# Patient Record
Sex: Male | Born: 1945
Health system: Southern US, Community
[De-identification: ages and names within clinical notes are randomized; demographics above are authoritative.]

## PROBLEM LIST (undated history)

## (undated) DIAGNOSIS — I10 Essential (primary) hypertension: Secondary | ICD-10-CM

## (undated) DIAGNOSIS — C801 Malignant (primary) neoplasm, unspecified: Secondary | ICD-10-CM

---

## 2002-10-04 ENCOUNTER — Encounter: Payer: Self-pay | Admitting: Emergency Medicine

## 2002-10-04 ENCOUNTER — Emergency Department (HOSPITAL_COMMUNITY): Admission: EM | Admit: 2002-10-04 | Discharge: 2002-10-04 | Payer: Self-pay | Admitting: Emergency Medicine

## 2009-02-19 ENCOUNTER — Encounter: Admission: RE | Admit: 2009-02-19 | Discharge: 2009-02-19 | Payer: Self-pay | Admitting: Family Medicine

## 2011-03-30 ENCOUNTER — Emergency Department (HOSPITAL_BASED_OUTPATIENT_CLINIC_OR_DEPARTMENT_OTHER)
Admission: EM | Admit: 2011-03-30 | Discharge: 2011-03-31 | Disposition: A | Discharge status: Home / Self Care | Payer: BC Managed Care – PPO | Attending: Emergency Medicine | Admitting: Emergency Medicine

## 2011-03-30 ENCOUNTER — Emergency Department (INDEPENDENT_AMBULATORY_CARE_PROVIDER_SITE_OTHER): Payer: BC Managed Care – PPO

## 2011-03-30 DIAGNOSIS — R05 Cough: Secondary | ICD-10-CM

## 2011-03-30 DIAGNOSIS — F17200 Nicotine dependence, unspecified, uncomplicated: Secondary | ICD-10-CM | POA: Insufficient documentation

## 2011-03-30 DIAGNOSIS — R0602 Shortness of breath: Secondary | ICD-10-CM | POA: Insufficient documentation

## 2011-03-30 DIAGNOSIS — R509 Fever, unspecified: Secondary | ICD-10-CM

## 2011-03-30 DIAGNOSIS — J40 Bronchitis, not specified as acute or chronic: Secondary | ICD-10-CM | POA: Insufficient documentation

## 2011-03-30 DIAGNOSIS — I10 Essential (primary) hypertension: Secondary | ICD-10-CM | POA: Insufficient documentation

## 2012-03-19 ENCOUNTER — Encounter (HOSPITAL_COMMUNITY): Payer: Self-pay | Admitting: Emergency Medicine

## 2012-03-19 ENCOUNTER — Emergency Department (HOSPITAL_COMMUNITY)
Admission: EM | Admit: 2012-03-19 | Discharge: 2012-03-19 | Disposition: A | Discharge status: Home / Self Care | Payer: BC Managed Care – PPO | Attending: Emergency Medicine | Admitting: Emergency Medicine

## 2012-03-19 DIAGNOSIS — L298 Other pruritus: Secondary | ICD-10-CM | POA: Insufficient documentation

## 2012-03-19 DIAGNOSIS — H5789 Other specified disorders of eye and adnexa: Secondary | ICD-10-CM | POA: Insufficient documentation

## 2012-03-19 DIAGNOSIS — J45909 Unspecified asthma, uncomplicated: Secondary | ICD-10-CM | POA: Insufficient documentation

## 2012-03-19 DIAGNOSIS — T7840XA Allergy, unspecified, initial encounter: Secondary | ICD-10-CM

## 2012-03-19 DIAGNOSIS — R079 Chest pain, unspecified: Secondary | ICD-10-CM | POA: Insufficient documentation

## 2012-03-19 DIAGNOSIS — R221 Localized swelling, mass and lump, neck: Secondary | ICD-10-CM | POA: Insufficient documentation

## 2012-03-19 DIAGNOSIS — I10 Essential (primary) hypertension: Secondary | ICD-10-CM | POA: Insufficient documentation

## 2012-03-19 DIAGNOSIS — L2989 Other pruritus: Secondary | ICD-10-CM | POA: Insufficient documentation

## 2012-03-19 DIAGNOSIS — R220 Localized swelling, mass and lump, head: Secondary | ICD-10-CM | POA: Insufficient documentation

## 2012-03-19 LAB — POCT I-STAT, CHEM 8
BUN: 14 mg/dL (ref 6–23)
Chloride: 103 mEq/L (ref 96–112)
HCT: 50 % (ref 39.0–52.0)
Potassium: 4.7 mEq/L (ref 3.5–5.1)
Sodium: 142 mEq/L (ref 135–145)

## 2012-03-19 MED ORDER — FAMOTIDINE IN NACL 20-0.9 MG/50ML-% IV SOLN
20.0000 mg | Freq: Once | INTRAVENOUS | Status: AC
  Administered 2012-03-19: 20 mg via INTRAVENOUS

## 2012-03-19 MED ORDER — PREDNISONE 20 MG PO TABS: 60.0000 mg | ORAL_TABLET | Freq: Every day | ORAL | Status: AC

## 2012-03-19 MED ORDER — DIPHENHYDRAMINE HCL 50 MG/ML IJ SOLN
25.0000 mg | Freq: Once | INTRAMUSCULAR | Status: AC
  Administered 2012-03-19: 50 mg via INTRAVENOUS

## 2012-03-19 MED ORDER — DIPHENHYDRAMINE HCL 25 MG PO CAPS: 25.0000 mg | ORAL_CAPSULE | Freq: Four times a day (QID) | ORAL | Status: DC | PRN

## 2012-03-19 MED ORDER — DIPHENHYDRAMINE HCL 50 MG/ML IJ SOLN
25.0000 mg | Freq: Once | INTRAMUSCULAR | Status: AC
  Administered 2012-03-19: 25 mg via INTRAVENOUS

## 2012-03-19 MED ORDER — METHYLPREDNISOLONE SODIUM SUCC 125 MG IJ SOLR
125.0000 mg | Freq: Once | INTRAMUSCULAR | Status: AC
  Administered 2012-03-19: 125 mg via INTRAVENOUS

## 2012-03-19 MED ORDER — FAMOTIDINE 20 MG PO TABS: 20.0000 mg | ORAL_TABLET | Freq: Two times a day (BID) | ORAL | Status: DC

## 2012-03-19 NOTE — ED Notes (Signed)
Pt states he ate shrimp,lobster and crab 6 days ago, the next day he began experiencing facial swelling, tonight swelling became severe, now eyes nearly swollen shut. Drainage present from eyes. Pt denies swelling to tongue, speech clear. Pt denies SOB. Pt also c/o L shoulder discomfort, EKG completed. Pt is A & O.

## 2012-03-19 NOTE — Discharge Instructions (Signed)

## 2012-03-19 NOTE — ED Notes (Signed)
Pt states he ate shellfish for 1st time last weekend,  Had facial swelling and some itching,  He used colds pack , states it went down some this week,  Then tonight he felt like he had ants crawling all over him

## 2012-03-19 NOTE — ED Notes (Signed)
Pt states urticaria returning, okay to give addt Benadryl per Dr. Dierdre Highman.

## 2012-03-19 NOTE — ED Notes (Signed)
Pt c/o facial swelling, specifically orbital edema onset 6 days ago. Pts eyes swollen shut at this time.

## 2012-03-19 NOTE — ED Provider Notes (Signed)
History     CSN: [id number]  Arrival date & time 03/19/12  4098   First MD Initiated Contact with Patient 03/19/12 [phone]      Chief Complaint  Patient presents with  . Allergic Reaction  . Chest Pain    (Consider location/radiation/quality/duration/timing/severity/associated sxs/prior treatment) HPI History provided by the patient. Ongoing facial swelling for about the last week, mostly affecting his eyelids. Patient attributes his symptoms to eating shrimp scampy a week ago. He has some associated itching in his face but no rash. No tongue lip or throat swelling. No difficulty breathing. He has not taken any medications for this. He denies any new soaps or lotions and has been using the same products last few months. No known allergies. Tonight he went to work and due to swelling was hardly able to see anything and sent home. He presents here for evaluation. He believes he had a similar reaction as a child to an unknown allergen, but no history of same otherwise. No insect bites. No medications. Is followed by primary care physician in [facility].  Past Medical History  Diagnosis Date  . Asthma     No past surgical history on file.  No family history on file.  History  Substance Use Topics  . Smoking status: Former Games developer  . Smokeless tobacco: Not on file  . Alcohol Use: No      Review of Systems  Constitutional: Negative for fever and chills.  HENT: Negative for ear pain, congestion, neck pain, neck stiffness, dental problem and sinus pressure.   Eyes: Negative for pain.  Respiratory: Negative for shortness of breath.   Cardiovascular: Negative for chest pain.  Gastrointestinal: Negative for nausea, vomiting and abdominal pain.  Genitourinary: Negative for dysuria.  Musculoskeletal: Negative for back pain.  Skin: Negative for rash.  Neurological: Negative for headaches.  All other systems reviewed and are negative.    Allergies  Review of patient's allergies  indicates no known allergies.  Home Medications  No current outpatient prescriptions on file.  BP 174/106  Pulse 76  Temp(Src) 98 F (36.7 C) (Oral)  Resp 18  Ht 5\' 7"  (1.702 m)  Wt 180 lb (81.647 kg)  BMI 28.19 kg/m2  SpO2 100%  Physical Exam  Constitutional: He is oriented to person, place, and time. He appears well-developed and well-nourished.  HENT:  Head: Normocephalic and atraumatic.       Bilateral facial swelling mostly involving upper and lower lids. Conjunctiva clear. No lingual or throat or lip involvement. Uvula midline. No submental fullness. No rash. No skin breaks. No stridor.  Eyes: Conjunctivae and EOM are normal. Pupils are equal, round, and reactive to light.  Neck: Trachea normal. Neck supple. No thyromegaly present.  Cardiovascular: Normal rate, regular rhythm, S1 normal, S2 normal and normal pulses.     No systolic murmur is present   No diastolic murmur is present  Pulses:      Radial pulses are 2+ on the right side, and 2+ on the left side.  Pulmonary/Chest: Effort normal and breath sounds normal. He has no wheezes. He has no rhonchi.  Abdominal: Soft. Normal appearance and bowel sounds are normal. There is no tenderness. There is no CVA tenderness and negative Murphy's sign.  Musculoskeletal:       BLE:s Calves nontender, no cords or erythema, negative Homans sign  Neurological: He is alert and oriented to person, place, and time. He has normal strength. No cranial nerve deficit or sensory deficit. GCS eye  subscore is 4. GCS verbal subscore is 5. GCS motor subscore is 6.  Skin: Skin is warm and dry. No rash noted. He is not diaphoretic.  Psychiatric: His speech is normal.       Cooperative and appropriate    ED Course  Procedures (including critical care time)  Results for orders placed during the hospital encounter of 03/19/12  POCT I-STAT, CHEM 8      Component Value Range   Sodium 142  135 - 145 (mEq/L)   Potassium 4.7  3.5 - 5.1 (mEq/L)    Chloride 103  96 - 112 (mEq/L)   BUN 14  6 - 23 (mg/dL)   Creatinine, Ser 8.29  0.50 - 1.35 (mg/dL)   Glucose, Bld 562 (*) 70 - 99 (mg/dL)   Calcium, Ion 1.30  8.65 - 1.32 (mmol/L)   TCO2 31  0 - 100 (mmol/L)   Hemoglobin 17.0  13.0 - 17.0 (g/dL)   HCT 78.4  69.6 - 29.5 (%)   IV Solu-Medrol, Benadryl and Pepcid.  Recheck at 5:30 AM is moderately improved, now able to open his eyes. Remains no oral or airway involvement.  Labs obtained and reviewed as above. No respiratory complaints or skin crepitus. No swelling otherwise.  MDM   Facial swelling / allergic reaction, unknown allergen. No airway involvement. Improved with medications as above. Had extensive long talk with patient about possible exposures or allergens. Very unlikely related to shrimp a week ago.  Patient to stop using facial soaps. Plan prednisone, Benadryl and Pepcid prescription and call the morning for followup with primary care physician. Reliable historian and agrees to strict return precautions for any throat or airway involvement.        Sunnie Nielsen, MD 03/19/12 [phone]

## 2013-10-17 ENCOUNTER — Emergency Department (HOSPITAL_COMMUNITY): Payer: Medicare Other

## 2013-10-17 ENCOUNTER — Emergency Department (HOSPITAL_COMMUNITY)
Admission: EM | Admit: 2013-10-17 | Discharge: 2013-10-17 | Disposition: A | Discharge status: Home / Self Care | Payer: Medicare Other | Attending: Emergency Medicine | Admitting: Emergency Medicine

## 2013-10-17 ENCOUNTER — Encounter (HOSPITAL_COMMUNITY): Payer: Self-pay | Admitting: Emergency Medicine

## 2013-10-17 DIAGNOSIS — R109 Unspecified abdominal pain: Secondary | ICD-10-CM | POA: Insufficient documentation

## 2013-10-17 DIAGNOSIS — R5381 Other malaise: Secondary | ICD-10-CM | POA: Insufficient documentation

## 2013-10-17 DIAGNOSIS — R509 Fever, unspecified: Secondary | ICD-10-CM | POA: Insufficient documentation

## 2013-10-17 DIAGNOSIS — R1110 Vomiting, unspecified: Secondary | ICD-10-CM

## 2013-10-17 DIAGNOSIS — J3489 Other specified disorders of nose and nasal sinuses: Secondary | ICD-10-CM | POA: Insufficient documentation

## 2013-10-17 DIAGNOSIS — R630 Anorexia: Secondary | ICD-10-CM | POA: Insufficient documentation

## 2013-10-17 DIAGNOSIS — I10 Essential (primary) hypertension: Secondary | ICD-10-CM | POA: Insufficient documentation

## 2013-10-17 DIAGNOSIS — R112 Nausea with vomiting, unspecified: Secondary | ICD-10-CM | POA: Insufficient documentation

## 2013-10-17 DIAGNOSIS — J45909 Unspecified asthma, uncomplicated: Secondary | ICD-10-CM | POA: Insufficient documentation

## 2013-10-17 DIAGNOSIS — Z87891 Personal history of nicotine dependence: Secondary | ICD-10-CM | POA: Insufficient documentation

## 2013-10-17 DIAGNOSIS — R748 Abnormal levels of other serum enzymes: Secondary | ICD-10-CM

## 2013-10-17 DIAGNOSIS — R34 Anuria and oliguria: Secondary | ICD-10-CM | POA: Insufficient documentation

## 2013-10-17 LAB — URINALYSIS, ROUTINE W REFLEX MICROSCOPIC
Hgb urine dipstick: NEGATIVE
Nitrite: NEGATIVE
Protein, ur: 30 mg/dL — AB
Urobilinogen, UA: >8 mg/dL — ABNORMAL HIGH (ref 0.0–1.0)

## 2013-10-17 LAB — HEPATITIS PANEL, ACUTE
HCV Ab: NEGATIVE
Hep A IgM: NONREACTIVE
Hep B C IgM: NONREACTIVE

## 2013-10-17 LAB — CBC WITH DIFFERENTIAL/PLATELET
Basophils Absolute: 0 10*3/uL (ref 0.0–0.1)
Eosinophils Relative: 0 % (ref 0–5)
HCT: 39.3 % (ref 39.0–52.0)
Hemoglobin: 13.3 g/dL (ref 13.0–17.0)
Lymphocytes Relative: 27 % (ref 12–46)
Lymphs Abs: 0.8 10*3/uL (ref 0.7–4.0)
MCV: 83.1 fL (ref 78.0–100.0)
Monocytes Absolute: 0.2 10*3/uL (ref 0.1–1.0)
Neutro Abs: 2.1 10*3/uL (ref 1.7–7.7)
RBC: 4.73 MIL/uL (ref 4.22–5.81)
RDW: 14.6 % (ref 11.5–15.5)
WBC: 3.1 10*3/uL — ABNORMAL LOW (ref 4.0–10.5)

## 2013-10-17 LAB — COMPREHENSIVE METABOLIC PANEL
ALT: 216 U/L — ABNORMAL HIGH (ref 0–53)
AST: 408 U/L — ABNORMAL HIGH (ref 0–37)
CO2: 31 mEq/L (ref 19–32)
Chloride: 93 mEq/L — ABNORMAL LOW (ref 96–112)
Creatinine, Ser: 1.15 mg/dL (ref 0.50–1.35)
GFR calc Af Amer: 75 mL/min — ABNORMAL LOW (ref >90)
GFR calc non Af Amer: 65 mL/min — ABNORMAL LOW (ref >90)
Glucose, Bld: 99 mg/dL (ref 70–99)
Total Bilirubin: 1.2 mg/dL (ref 0.3–1.2)

## 2013-10-17 LAB — URINE MICROSCOPIC-ADD ON

## 2013-10-17 MED ORDER — ONDANSETRON HCL 4 MG/2ML IJ SOLN
4.0000 mg | Freq: Once | INTRAMUSCULAR | Status: AC
  Administered 2013-10-17: 4 mg via INTRAVENOUS
  Filled 2013-10-17: qty 2

## 2013-10-17 MED ORDER — SODIUM CHLORIDE 0.9 % IV BOLUS (SEPSIS)
1000.0000 mL | Freq: Once | INTRAVENOUS | Status: AC
  Administered 2013-10-17: 1000 mL via INTRAVENOUS

## 2013-10-17 MED ORDER — POTASSIUM CHLORIDE 10 MEQ/100ML IV SOLN: 10.0000 meq | Freq: Once | INTRAVENOUS | Status: DC

## 2013-10-17 MED ORDER — POTASSIUM CHLORIDE 20 MEQ PO PACK: 40.0000 meq | PACK | Freq: Once | ORAL | Status: DC

## 2013-10-17 MED ORDER — POTASSIUM CHLORIDE CRYS ER 20 MEQ PO TBCR
40.0000 meq | EXTENDED_RELEASE_TABLET | Freq: Once | ORAL | Status: AC
  Administered 2013-10-17: 40 meq via ORAL
  Filled 2013-10-17: qty 2

## 2013-10-17 MED ORDER — ACETAMINOPHEN 500 MG PO TABS
1000.0000 mg | ORAL_TABLET | Freq: Once | ORAL | Status: AC
  Administered 2013-10-17: 1000 mg via ORAL
  Filled 2013-10-17: qty 2

## 2013-10-17 NOTE — ED Provider Notes (Signed)
Plains of generalized weakness and cough and subjective fever onset one week ago. No shortness of breath cough is productive of clear sputum 2 days ago. Cough is dry at present. He feels improved today over 2 days ago. Presently hungry on exam alert nontoxic Glasgow Coma Score 15 HEENT exam no facial asymmetry mucous membranes moist neck supple trachea midline lungs clear auscultation abdomen nondistended nontender heart regular rate and rhythm no murmurs.  Doug Sou, MD 10/17/13 1407

## 2013-10-17 NOTE — ED Notes (Signed)
Pt c/o generalized body aches intermittently x 2 months since having flu shot; pt sts increased x 1 week; pt poor historian

## 2013-10-17 NOTE — ED Notes (Addendum)
Pt at xray   Lab called to send VTM swabs per micro for influenza panel.

## 2013-10-17 NOTE — ED Provider Notes (Signed)
CSN: [id number]     Arrival date & time 10/17/13  1210 History   First MD Initiated Contact with Patient 10/17/13 1219     Chief Complaint  Patient presents with  . Generalized Body Aches   (Consider location/radiation/quality/duration/timing/severity/associated sxs/prior Treatment) HPI Comments: 67 yo male with hx of asthma and HTN presents with fever, though not quantified, x 1 week accompanied by generalized malaise, decreased appetite, fatigue, intermittent cough occasionally productive of whitish sputum and sporadic N/V. Reports developing symptoms shortly after having a flu shot at the [place]. Symptoms worse during the night, not improved with OTC cold medications. Denies abd pain, headache. Endorses dark colored urine with occasional suprapubic tenderness, though denies dysuria.  The history is provided by the patient.    Past Medical History  Diagnosis Date  . Asthma    History reviewed. No pertinent past surgical history. History reviewed. No pertinent family history. History  Substance Use Topics  . Smoking status: Former Games developer  . Smokeless tobacco: Not on file  . Alcohol Use: No    Review of Systems  Constitutional: Negative for fever.  HENT: Positive for rhinorrhea. Negative for sore throat.   Eyes: Negative for visual disturbance.  Respiratory: Negative for cough.   Cardiovascular: Negative for chest pain, palpitations and leg swelling.  Gastrointestinal: Positive for nausea, vomiting and abdominal pain. Negative for diarrhea and constipation.  Genitourinary: Positive for decreased urine volume. Negative for dysuria.  Musculoskeletal: Positive for myalgias. Negative for back pain, gait problem and joint swelling.  Skin: Negative for rash.  Neurological: Negative for headaches.  Hematological: Negative for adenopathy.  Psychiatric/Behavioral: Negative for agitation.    Allergies  Review of patient's allergies indicates no known allergies.  Home Medications    Current Outpatient Rx  Name  Route  Sig  Dispense  Refill  . hydrochlorothiazide (HYDRODIURIL) 25 MG tablet   Oral   Take 12.5 mg by mouth daily as needed (for elevated blood pressure).         . EXPIRED: diphenhydrAMINE (BENADRYL) 25 mg capsule   Oral   Take 1 capsule (25 mg total) by mouth every 6 (six) hours as needed for itching.   30 capsule   0   . EXPIRED: famotidine (PEPCID) 20 MG tablet   Oral   Take 1 tablet (20 mg total) by mouth 2 (two) times daily.   30 tablet   0    BP 145/73  Pulse 87  Temp(Src) 99.9 F (37.7 C) (Oral)  Resp 18  Wt 185 lb 6.4 oz (84.097 kg)  SpO2 97% Physical Exam  Nursing note and vitals reviewed. Constitutional: He is oriented to person, place, and time. He appears well-developed and well-nourished.  HENT:  Head: Normocephalic and atraumatic.  Right Ear: External ear normal.  Left Ear: External ear normal.  Nose: Nose normal.  Mouth/Throat: Oropharynx is clear and moist.  Eyes: Conjunctivae are normal.  Neck: Normal range of motion. Neck supple.  Cardiovascular: Normal rate, regular rhythm, normal heart sounds and intact distal pulses.   Pulmonary/Chest: Effort normal and breath sounds normal.  Abdominal: Soft. Bowel sounds are normal. There is no tenderness.  Musculoskeletal: Normal range of motion. He exhibits no edema and no tenderness.  Lymphadenopathy:    He has no cervical adenopathy.  Neurological: He is alert and oriented to person, place, and time.  Skin: Skin is warm and dry. No rash noted.  Psychiatric: He has a normal mood and affect.    ED Course  Procedures (  including critical care time) Labs Review Labs Reviewed  CBC WITH DIFFERENTIAL - Abnormal; Notable for the following:    WBC 3.1 (*)    All other components within normal limits  COMPREHENSIVE METABOLIC PANEL - Abnormal; Notable for the following:    Sodium 133 (*)    Potassium 2.6 (*)    Chloride 93 (*)    Calcium 8.2 (*)    Albumin 3.1 (*)    AST  408 (*)    ALT 216 (*)    Alkaline Phosphatase 140 (*)    GFR calc non Af Amer 65 (*)    GFR calc Af Amer 75 (*)    All other components within normal limits  URINALYSIS, ROUTINE W REFLEX MICROSCOPIC - Abnormal; Notable for the following:    Color, Urine AMBER (*)    APPearance HAZY (*)    Bilirubin Urine SMALL (*)    Ketones, ur 15 (*)    Protein, ur 30 (*)    Urobilinogen, UA >8.0 (*)    Leukocytes, UA TRACE (*)    All other components within normal limits  URINE MICROSCOPIC-ADD ON - Abnormal; Notable for the following:    Squamous Epithelial / LPF FEW (*)    All other components within normal limits  HEPATITIS PANEL, ACUTE  PROTIME-INR   Imaging Review Dg Chest 2 View  10/17/2013   CLINICAL DATA:  Generalized body ache  EXAM: CHEST  2 VIEW  COMPARISON:  03/30/2011  FINDINGS: Cardiomediastinal silhouette is stable. No acute infiltrate or pleural effusion. No pulmonary edema. Stable degenerative changes thoracic spine.  IMPRESSION: No active cardiopulmonary disease.  No significant change.   Electronically Signed   By: Natasha Mead M.D.   On: 10/17/2013 13:13    EKG Interpretation    Date/Time:  Wednesday October 17 2013 15:34:00 EST Ventricular Rate:  81 PR Interval:  159 QRS Duration: 115 QT Interval:  396 QTC Calculation: 460 R Axis:   5 Text Interpretation:  Sinus rhythm Multiple ventricular premature complexes Incomplete right bundle branch block Inferior infarct, old No old tracing to compare Confirmed by JACUBOWITZ  MD, SAM ([id number]) on 10/17/2013 3:43:57 PM            MDM   1. Elevated liver enzymes   2. Vomiting    67 yo patient with malaise, subjective fever, intermittent N/V and decreased appetite with dark urine. Afebrile and nontoxic appearing in the ED with stable VS. Broad based infectious workup initiated. CXR neg, no pneumonia. UA neg, no UTI.  Labs reveal hypokalemia and mild dehydration with marked elevation of LFT's, suggestive of hepatitis.  Potassium supplemented orally. PT/INR checked and WNL. After hydration and zofran, patient able to tolerate oral fluids. Reevaluation of the abd reveals soft and nontender. Felt stable for d/c. A hepatitis panel was sent, patient to arrange f/u with the PCP at the [facility] for hepatitis results and further evaluation. Discussed with patient that f/u was imperative as he needed further outpatient workup to determine the source of the LFT abnormality. He verbalized understanding. Strict return instructions discussed and provided to the patient in writing at time of d/c.    Simmie Davies, NP 10/18/13 1149

## 2013-10-19 ENCOUNTER — Telehealth (HOSPITAL_COMMUNITY): Payer: Self-pay | Admitting: Emergency Medicine

## 2013-10-19 NOTE — ED Notes (Signed)
[facility] called asking for results and labwork because patient was referred to them for a followup. ED summary faxed to [phone]

## 2013-10-19 NOTE — ED Provider Notes (Signed)
Medical screening examination/treatment/procedure(s) were conducted as a shared visit with non-physician practitioner(s) and myself.  I personally evaluated the patient during the encounter.  EKG Interpretation    Date/Time:  Wednesday October 17 2013 15:34:00 EST Ventricular Rate:  81 PR Interval:  159 QRS Duration: 115 QT Interval:  396 QTC Calculation: 460 R Axis:   5 Text Interpretation:  Sinus rhythm Multiple ventricular premature complexes Incomplete right bundle branch block Inferior infarct, old No old tracing to compare Confirmed by Ethelda Chick  MD, Asli Tokarski ([id number]) on 10/17/2013 3:43:57 PM             Doug Sou, MD 10/19/13 1147

## 2017-07-25 ENCOUNTER — Emergency Department (HOSPITAL_COMMUNITY): Payer: Medicare Other

## 2017-07-25 ENCOUNTER — Emergency Department (HOSPITAL_COMMUNITY)
Admission: EM | Admit: 2017-07-25 | Discharge: 2017-07-25 | Disposition: A | Discharge status: Home / Self Care | Payer: Medicare Other | Attending: Emergency Medicine | Admitting: Emergency Medicine

## 2017-07-25 ENCOUNTER — Encounter (HOSPITAL_COMMUNITY): Payer: Self-pay | Admitting: Neurology

## 2017-07-25 DIAGNOSIS — J45909 Unspecified asthma, uncomplicated: Secondary | ICD-10-CM | POA: Insufficient documentation

## 2017-07-25 DIAGNOSIS — Y9389 Activity, other specified: Secondary | ICD-10-CM | POA: Insufficient documentation

## 2017-07-25 DIAGNOSIS — F17210 Nicotine dependence, cigarettes, uncomplicated: Secondary | ICD-10-CM | POA: Insufficient documentation

## 2017-07-25 DIAGNOSIS — Y999 Unspecified external cause status: Secondary | ICD-10-CM | POA: Diagnosis not present

## 2017-07-25 DIAGNOSIS — Z79899 Other long term (current) drug therapy: Secondary | ICD-10-CM | POA: Diagnosis not present

## 2017-07-25 DIAGNOSIS — I10 Essential (primary) hypertension: Secondary | ICD-10-CM | POA: Insufficient documentation

## 2017-07-25 DIAGNOSIS — R55 Syncope and collapse: Secondary | ICD-10-CM

## 2017-07-25 DIAGNOSIS — Y929 Unspecified place or not applicable: Secondary | ICD-10-CM | POA: Insufficient documentation

## 2017-07-25 HISTORY — DX: Malignant (primary) neoplasm, unspecified: C80.1

## 2017-07-25 HISTORY — DX: Essential (primary) hypertension: I10

## 2017-07-25 LAB — TYPE AND SCREEN
ABO/RH(D): O POS
ANTIBODY SCREEN: NEGATIVE
UNIT DIVISION: 0
Unit division: 0

## 2017-07-25 LAB — PREPARE FRESH FROZEN PLASMA
UNIT DIVISION: 0
UNIT DIVISION: 0

## 2017-07-25 LAB — BPAM RBC
Blood Product Expiration Date: 201809302359
Blood Product Expiration Date: 201809302359
ISSUE DATE / TIME: 201809031020
ISSUE DATE / TIME: 201809031020
UNIT TYPE AND RH: 9500
Unit Type and Rh: 9500

## 2017-07-25 LAB — COMPREHENSIVE METABOLIC PANEL
ALK PHOS: 87 U/L (ref 38–126)
ALT: 14 U/L — AB (ref 17–63)
AST: 29 U/L (ref 15–41)
Albumin: 3.5 g/dL (ref 3.5–5.0)
Anion gap: 8 (ref 5–15)
BILIRUBIN TOTAL: 0.6 mg/dL (ref 0.3–1.2)
BUN: 11 mg/dL (ref 6–20)
CALCIUM: 8.5 mg/dL — AB (ref 8.9–10.3)
CO2: 24 mmol/L (ref 22–32)
CREATININE: 1.47 mg/dL — AB (ref 0.61–1.24)
Chloride: 107 mmol/L (ref 101–111)
GFR calc Af Amer: 54 mL/min — ABNORMAL LOW (ref >60)
GFR calc non Af Amer: 47 mL/min — ABNORMAL LOW (ref >60)
GLUCOSE: 117 mg/dL — AB (ref 65–99)
POTASSIUM: 3.7 mmol/L (ref 3.5–5.1)
Sodium: 139 mmol/L (ref 135–145)
Total Protein: 6.6 g/dL (ref 6.5–8.1)

## 2017-07-25 LAB — PROTIME-INR
INR: 1.03
PROTHROMBIN TIME: 13.4 s (ref 11.4–15.2)

## 2017-07-25 LAB — CBC
HEMATOCRIT: 37.7 % — AB (ref 39.0–52.0)
Hemoglobin: 11.6 g/dL — ABNORMAL LOW (ref 13.0–17.0)
MCH: 25.6 pg — ABNORMAL LOW (ref 26.0–34.0)
MCHC: 30.8 g/dL (ref 30.0–36.0)
MCV: 83 fL (ref 78.0–100.0)
Platelets: 202 10*3/uL (ref 150–400)
RBC: 4.54 MIL/uL (ref 4.22–5.81)
RDW: 18.1 % — AB (ref 11.5–15.5)
WBC: 4.3 10*3/uL (ref 4.0–10.5)

## 2017-07-25 LAB — I-STAT TROPONIN, ED: Troponin i, poc: 0.01 ng/mL (ref 0.00–0.08)

## 2017-07-25 LAB — URINALYSIS, ROUTINE W REFLEX MICROSCOPIC
Bacteria, UA: NONE SEEN
Bilirubin Urine: NEGATIVE
Glucose, UA: NEGATIVE mg/dL
Hgb urine dipstick: NEGATIVE
Ketones, ur: 5 mg/dL — AB
Leukocytes, UA: NEGATIVE
Nitrite: NEGATIVE
PH: 6 (ref 5.0–8.0)
PROTEIN: 100 mg/dL — AB
RBC / HPF: NONE SEEN RBC/hpf (ref 0–5)
Specific Gravity, Urine: 1.029 (ref 1.005–1.030)

## 2017-07-25 LAB — ETHANOL: Alcohol, Ethyl (B): <5 mg/dL (ref <5)

## 2017-07-25 LAB — I-STAT CHEM 8, ED
BUN: 13 mg/dL (ref 6–20)
CHLORIDE: 104 mmol/L (ref 101–111)
Calcium, Ion: 1.04 mmol/L — ABNORMAL LOW (ref 1.15–1.40)
Creatinine, Ser: 1.3 mg/dL — ABNORMAL HIGH (ref 0.61–1.24)
Glucose, Bld: 120 mg/dL — ABNORMAL HIGH (ref 65–99)
HEMATOCRIT: 39 % (ref 39.0–52.0)
Hemoglobin: 13.3 g/dL (ref 13.0–17.0)
Potassium: 3.8 mmol/L (ref 3.5–5.1)
SODIUM: 140 mmol/L (ref 135–145)
TCO2: 28 mmol/L (ref 22–32)

## 2017-07-25 LAB — BPAM FFP
BLOOD PRODUCT EXPIRATION DATE: 201809192359
BLOOD PRODUCT EXPIRATION DATE: 201809192359
ISSUE DATE / TIME: 201809031021
ISSUE DATE / TIME: 201809031021
UNIT TYPE AND RH: 6200
Unit Type and Rh: 6200

## 2017-07-25 LAB — CDS SEROLOGY

## 2017-07-25 LAB — ABO/RH: ABO/RH(D): O POS

## 2017-07-25 LAB — I-STAT CG4 LACTIC ACID, ED: LACTIC ACID, VENOUS: 1.64 mmol/L (ref 0.5–1.9)
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
## 2017-07-25 NOTE — Discharge Instructions (Signed)
Do not drive until cleared by your doctor.  Get rechecked immediately if you have any new or worrisome symptoms.

## 2017-07-25 NOTE — Progress Notes (Signed)
   07/25/17 1000  Clinical Encounter Type  Visited With Health care provider  Visit Type Trauma;ED  Referral From Care management  Stress Factors  Patient Stress Factors None identified  Family Stress Factors None identified     Checked in with ED nurse, checked in with Registration,chaplain made call to emergency contact-not available (service disconnected) Chaplain on stand by when able to see the patient. Conard Novak, Chaplain
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
## 2017-07-25 NOTE — ED Notes (Signed)
Dr. Ralene Bathe reports can remove c-collar, needs orthostatic VS, urine sample, meds reconciled with pharmacy.

## 2017-07-25 NOTE — ED Notes (Signed)
Patient transported to CT 

## 2017-07-25 NOTE — Progress Notes (Signed)
   07/25/17 1100  Clinical Encounter Type  Visited With Patient  Visit Type Initial;Social support;ED;Trauma  Stress Factors  Patient Stress Factors Health changes;Major life changes  Family Stress Factors Financial concerns;None identified    Spoke with patient who was alert. Told him we tried to contact emergency contact-not successful. The patient did not want emergency contact with anyone.  Spoke to [employer] briefly. Chaplain remains on standby. Conard Novak, Chaplain

## 2017-07-26 ENCOUNTER — Encounter (HOSPITAL_COMMUNITY): Payer: Self-pay | Admitting: Emergency Medicine

## 2018-02-17 DIAGNOSIS — K635 Polyp of colon: Secondary | ICD-10-CM | POA: Diagnosis not present

## 2018-02-17 DIAGNOSIS — Z86010 Personal history of colonic polyps: Secondary | ICD-10-CM | POA: Diagnosis not present

## 2018-02-17 DIAGNOSIS — C089 Malignant neoplasm of major salivary gland, unspecified: Secondary | ICD-10-CM | POA: Diagnosis not present

## 2018-02-17 DIAGNOSIS — Z85818 Personal history of malignant neoplasm of other sites of lip, oral cavity, and pharynx: Secondary | ICD-10-CM | POA: Diagnosis not present

## 2018-09-24 ENCOUNTER — Emergency Department (HOSPITAL_COMMUNITY)
Admission: EM | Admit: 2018-09-24 | Discharge: 2018-09-24 | Disposition: A | Discharge status: Home / Self Care | Payer: Medicare HMO | Attending: Emergency Medicine | Admitting: Emergency Medicine

## 2018-09-24 ENCOUNTER — Encounter (HOSPITAL_COMMUNITY): Payer: Self-pay | Admitting: Emergency Medicine

## 2018-09-24 DIAGNOSIS — R55 Syncope and collapse: Secondary | ICD-10-CM

## 2018-09-24 DIAGNOSIS — F17210 Nicotine dependence, cigarettes, uncomplicated: Secondary | ICD-10-CM | POA: Insufficient documentation

## 2018-09-24 DIAGNOSIS — Z79899 Other long term (current) drug therapy: Secondary | ICD-10-CM | POA: Diagnosis not present

## 2018-09-24 DIAGNOSIS — J45909 Unspecified asthma, uncomplicated: Secondary | ICD-10-CM | POA: Diagnosis not present

## 2018-09-24 DIAGNOSIS — Z7982 Long term (current) use of aspirin: Secondary | ICD-10-CM | POA: Insufficient documentation

## 2018-09-24 DIAGNOSIS — I10 Essential (primary) hypertension: Secondary | ICD-10-CM | POA: Insufficient documentation

## 2018-09-24 DIAGNOSIS — I959 Hypotension, unspecified: Secondary | ICD-10-CM | POA: Diagnosis not present

## 2018-09-24 LAB — CBC
HCT: 44 % (ref 39.0–52.0)
Hemoglobin: 13.4 g/dL (ref 13.0–17.0)
MCH: 27 pg (ref 26.0–34.0)
MCHC: 30.5 g/dL (ref 30.0–36.0)
MCV: 88.5 fL (ref 80.0–100.0)
Platelets: 223 10*3/uL (ref 150–400)
RBC: 4.97 MIL/uL (ref 4.22–5.81)
RDW: 17.2 % — ABNORMAL HIGH (ref 11.5–15.5)
WBC: 6 10*3/uL (ref 4.0–10.5)
nRBC: 0 % (ref 0.0–0.2)

## 2018-09-24 LAB — BASIC METABOLIC PANEL
Anion gap: 9 (ref 5–15)
BUN: 14 mg/dL (ref 8–23)
CO2: 25 mmol/L (ref 22–32)
Calcium: 8.8 mg/dL — ABNORMAL LOW (ref 8.9–10.3)
Chloride: 106 mmol/L (ref 98–111)
Creatinine, Ser: 1.86 mg/dL — ABNORMAL HIGH (ref 0.61–1.24)
GFR calc Af Amer: 41 mL/min — ABNORMAL LOW (ref >60)
GFR calc non Af Amer: 35 mL/min — ABNORMAL LOW (ref >60)
Glucose, Bld: 129 mg/dL — ABNORMAL HIGH (ref 70–99)
Potassium: 3.6 mmol/L (ref 3.5–5.1)
Sodium: 140 mmol/L (ref 135–145)

## 2018-09-24 LAB — I-STAT TROPONIN, ED: Troponin i, poc: 0.01 ng/mL (ref 0.00–0.08)

## 2018-09-24 MED ORDER — SODIUM CHLORIDE 0.9 % IV BOLUS
500.0000 mL | Freq: Once | INTRAVENOUS | Status: AC
  Administered 2018-09-24: 500 mL via INTRAVENOUS

## 2018-09-24 NOTE — ED Triage Notes (Signed)
Brought from home via ems after having a syncopal episode while putting together a tv stand.  Family reports he was bent over then fell to the ground.  Denies any pain or injury.  Per ems patient had another syncopal episode from standing position in their presence and was assisted to the ground.  Patient reports not eating since yesterday due to fasting.

## 2018-10-05 NOTE — ED Provider Notes (Signed)
[facility] EMERGENCY DEPARTMENT Provider Note   CSN: [id number] Arrival date & time: 09/24/18  2116     History   Chief Complaint Chief Complaint  Patient presents with  . Loss of Consciousness    HPI Bradley Silva is a 72 y.o. male.  HPI   72 year old male with a syncopal event.  Patient states that he was putting together a TV stand when he went to get up after being bent over he began feeling lightheaded and apparently had a syncopal event.  Very brief loss of consciousness with a quick return to baseline.  No incontinence or oral trauma.  No witnessed seizure activity.  Patient began having her single symptoms again when getting up to a standing position.  He currently has no complaints.  He denies any pain.  He reports that is in her usual state health earlier in the day.  He reports that is been fasting since yesterday.  Past Medical History:  Diagnosis Date  . Asthma   . Cancer ([place])   . Hypertension     There are no active problems to display for this patient.   History reviewed. No pertinent surgical history.      Home Medications    Prior to Admission medications   Medication Sig Start Date End Date Taking? Authorizing Provider  aspirin EC 81 MG tablet Take 81 mg by mouth daily.   Yes [provider]  hydrochlorothiazide (HYDRODIURIL) 25 MG tablet Take 12.5 mg by mouth daily as needed (for elevated blood pressure).   Yes [provider]    Family History No family history on file.  Social History Social History   Tobacco Use  . Smoking status: Current Every Day Smoker    Types: Cigarettes  . Smokeless tobacco: Never Used  Substance Use Topics  . Alcohol use: No  . Drug use: No     Allergies   Ivp dye [iodinated diagnostic agents] and Shellfish allergy   Review of Systems Review of Systems  All systems reviewed and negative, other than as noted in HPI.  Physical Exam Updated Vital Signs BP 137/86    Pulse 71   Temp 98.7 F (37.1 C) (Oral)   Resp 19   Ht 5\' 7"  (1.702 m)   Wt 79.4 kg   SpO2 100%   BMI 27.42 kg/m   Physical Exam  Constitutional: He appears well-developed and well-nourished. No distress.  HENT:  Head: Normocephalic and atraumatic.  Eyes: Conjunctivae are normal. Right eye exhibits no discharge. Left eye exhibits no discharge.  Neck: Neck supple.  Cardiovascular: Normal rate, regular rhythm and normal heart sounds. Exam reveals no gallop and no friction rub.  No murmur heard. Pulmonary/Chest: Effort normal and breath sounds normal. No respiratory distress.  Abdominal: Soft. He exhibits no distension. There is no tenderness.  Musculoskeletal: He exhibits no edema or tenderness.  Neurological: He is alert.  Skin: Skin is warm and dry.  Psychiatric: He has a normal mood and affect. His behavior is normal. Thought content normal.  Nursing note and vitals reviewed.    ED Treatments / Results  Labs (all labs ordered are listed, but only abnormal results are displayed) Labs Reviewed  BASIC METABOLIC PANEL - Abnormal; Notable for the following components:      Result Value   Glucose, Bld 129 (*)    Creatinine, Ser 1.86 (*)    Calcium 8.8 (*)    GFR calc non Af Amer 35 (*)  GFR calc Af Amer 41 (*)    All other components within normal limits  CBC - Abnormal; Notable for the following components:   RDW 17.2 (*)    All other components within normal limits  I-STAT TROPONIN, ED    EKG EKG Interpretation  Date/Time:  "Sunday September 24 2018 21:29:59 EST Ventricular Rate:  80 PR Interval:    QRS Duration: 106 QT Interval:  419 QTC Calculation: 484 R Axis:   -68 Text Interpretation:  Sinus rhythm Incomplete RBBB and LAFB RSR' in V1 or V2, right VCD or RVH Borderline prolonged QT interval Confirmed by Lavonte Palos ([id number]) on 09/24/2018 10:27:17 PM Also confirmed by Malerie Eakins ([id number]), editor [place], [place] ([id number])  on 09/25/2018 7:33:56  AM   Radiology No results found.   Procedures Procedures (including critical care time)  Medications Ordered in ED Medications  sodium chloride 0.9 % bolus 500 mL (0 mLs Intravenous Stopped 09/24/18 2335)     Initial Impression / Assessment and Plan / ED Course  I have reviewed the triage vital signs and the nursing notes.  Pertinent labs & imaging results that were available during my care of the patient were reviewed by me and considered in my medical decision making (see chart for details).     72"  year old male with syncope.  Currently asymptomatic.  He dynamically stable.  May be some element of orthostasis given his description of symptoms and fasting since yesterday.  He was given half a liter of IV fluids.  He reports that he feels much better.  ED work-up fairly unremarkable.  At this point in time I have a low suspicion for emergent process.  Advised to stay well-hydrated.  Needs further evaluation if he has continued symptoms.  Final Clinical Impressions(s) / ED Diagnoses   Final diagnoses:  Syncope and collapse    ED Discharge Orders    None       Virgel Manifold, MD 10/05/18 [phone]

## 2019-06-13 ENCOUNTER — Inpatient Hospital Stay (HOSPITAL_COMMUNITY)
Admission: EM | Admit: 2019-06-13 | Discharge: 2019-06-19 | DRG: 083 | Disposition: A | Discharge status: Home Health | Payer: Medicare HMO | Attending: General Surgery | Admitting: General Surgery

## 2019-06-13 ENCOUNTER — Emergency Department (HOSPITAL_COMMUNITY): Payer: Medicare HMO

## 2019-06-13 ENCOUNTER — Other Ambulatory Visit: Payer: Self-pay

## 2019-06-13 ENCOUNTER — Encounter (HOSPITAL_COMMUNITY): Payer: Self-pay | Admitting: *Deleted

## 2019-06-13 DIAGNOSIS — S32029A Unspecified fracture of second lumbar vertebra, initial encounter for closed fracture: Secondary | ICD-10-CM | POA: Diagnosis present

## 2019-06-13 DIAGNOSIS — S32039A Unspecified fracture of third lumbar vertebra, initial encounter for closed fracture: Secondary | ICD-10-CM | POA: Diagnosis present

## 2019-06-13 DIAGNOSIS — S066X9A Traumatic subarachnoid hemorrhage with loss of consciousness of unspecified duration, initial encounter: Secondary | ICD-10-CM | POA: Diagnosis not present

## 2019-06-13 DIAGNOSIS — M549 Dorsalgia, unspecified: Secondary | ICD-10-CM

## 2019-06-13 DIAGNOSIS — Z7982 Long term (current) use of aspirin: Secondary | ICD-10-CM

## 2019-06-13 DIAGNOSIS — N189 Chronic kidney disease, unspecified: Secondary | ICD-10-CM | POA: Diagnosis present

## 2019-06-13 DIAGNOSIS — Z20828 Contact with and (suspected) exposure to other viral communicable diseases: Secondary | ICD-10-CM | POA: Diagnosis present

## 2019-06-13 DIAGNOSIS — F17210 Nicotine dependence, cigarettes, uncomplicated: Secondary | ICD-10-CM | POA: Diagnosis present

## 2019-06-13 DIAGNOSIS — S32019A Unspecified fracture of first lumbar vertebra, initial encounter for closed fracture: Secondary | ICD-10-CM | POA: Diagnosis present

## 2019-06-13 DIAGNOSIS — I129 Hypertensive chronic kidney disease with stage 1 through stage 4 chronic kidney disease, or unspecified chronic kidney disease: Secondary | ICD-10-CM | POA: Diagnosis present

## 2019-06-13 DIAGNOSIS — K5732 Diverticulitis of large intestine without perforation or abscess without bleeding: Secondary | ICD-10-CM | POA: Diagnosis present

## 2019-06-13 DIAGNOSIS — Y92410 Unspecified street and highway as the place of occurrence of the external cause: Secondary | ICD-10-CM

## 2019-06-13 DIAGNOSIS — R319 Hematuria, unspecified: Secondary | ICD-10-CM | POA: Diagnosis not present

## 2019-06-13 DIAGNOSIS — Z91041 Radiographic dye allergy status: Secondary | ICD-10-CM

## 2019-06-13 DIAGNOSIS — S065X9A Traumatic subdural hemorrhage with loss of consciousness of unspecified duration, initial encounter: Secondary | ICD-10-CM | POA: Diagnosis present

## 2019-06-13 DIAGNOSIS — J45909 Unspecified asthma, uncomplicated: Secondary | ICD-10-CM | POA: Diagnosis present

## 2019-06-13 DIAGNOSIS — Z79899 Other long term (current) drug therapy: Secondary | ICD-10-CM

## 2019-06-13 DIAGNOSIS — Z91013 Allergy to seafood: Secondary | ICD-10-CM

## 2019-06-13 LAB — CBC WITH DIFFERENTIAL/PLATELET
Abs Immature Granulocytes: 0.07 10*3/uL (ref 0.00–0.07)
Basophils Absolute: 0 10*3/uL (ref 0.0–0.1)
Basophils Relative: 0 %
Eosinophils Absolute: 0.1 10*3/uL (ref 0.0–0.5)
Eosinophils Relative: 1 %
HCT: 46 % (ref 39.0–52.0)
Hemoglobin: 14.2 g/dL (ref 13.0–17.0)
Immature Granulocytes: 1 %
Lymphocytes Relative: 7 %
Lymphs Abs: 0.9 10*3/uL (ref 0.7–4.0)
MCH: 27.6 pg (ref 26.0–34.0)
MCHC: 30.9 g/dL (ref 30.0–36.0)
MCV: 89.3 fL (ref 80.0–100.0)
Monocytes Absolute: 0.9 10*3/uL (ref 0.1–1.0)
Monocytes Relative: 8 %
Neutro Abs: 9.6 10*3/uL — ABNORMAL HIGH (ref 1.7–7.7)
Neutrophils Relative %: 83 %
Platelets: 240 10*3/uL (ref 150–400)
RBC: 5.15 MIL/uL (ref 4.22–5.81)
RDW: 16.4 % — ABNORMAL HIGH (ref 11.5–15.5)
WBC: 11.5 10*3/uL — ABNORMAL HIGH (ref 4.0–10.5)
nRBC: 0 % (ref 0.0–0.2)

## 2019-06-13 LAB — BASIC METABOLIC PANEL
Anion gap: 9 (ref 5–15)
BUN: 20 mg/dL (ref 8–23)
CO2: 27 mmol/L (ref 22–32)
Calcium: 9.3 mg/dL (ref 8.9–10.3)
Chloride: 103 mmol/L (ref 98–111)
Creatinine, Ser: 1.35 mg/dL — ABNORMAL HIGH (ref 0.61–1.24)
GFR calc Af Amer: >60 mL/min (ref >60)
GFR calc non Af Amer: 52 mL/min — ABNORMAL LOW (ref >60)
Glucose, Bld: 128 mg/dL — ABNORMAL HIGH (ref 70–99)
Potassium: 3.6 mmol/L (ref 3.5–5.1)
Sodium: 139 mmol/L (ref 135–145)

## 2019-06-13 LAB — HEPATIC FUNCTION PANEL
ALT: 47 U/L — ABNORMAL HIGH (ref 0–44)
AST: 93 U/L — ABNORMAL HIGH (ref 15–41)
Albumin: 3.7 g/dL (ref 3.5–5.0)
Alkaline Phosphatase: 99 U/L (ref 38–126)
Bilirubin, Direct: 0.2 mg/dL (ref 0.0–0.2)
Indirect Bilirubin: 0.3 mg/dL (ref 0.3–0.9)
Total Bilirubin: 0.5 mg/dL (ref 0.3–1.2)
Total Protein: 7.1 g/dL (ref 6.5–8.1)

## 2019-06-13 LAB — ETHANOL: Alcohol, Ethyl (B): <10 mg/dL (ref <10)

## 2019-06-13 LAB — PROTIME-INR
INR: 1.1 (ref 0.8–1.2)
Prothrombin Time: 13.9 seconds (ref 11.4–15.2)

## 2019-06-13 MED ORDER — ONDANSETRON HCL 4 MG/2ML IJ SOLN
4.0000 mg | Freq: Once | INTRAMUSCULAR | Status: AC
  Administered 2019-06-13: 4 mg via INTRAVENOUS
  Filled 2019-06-13: qty 2

## 2019-06-13 MED ORDER — MORPHINE SULFATE (PF) 4 MG/ML IV SOLN
4.0000 mg | Freq: Once | INTRAVENOUS | Status: AC
  Administered 2019-06-13: 4 mg via INTRAVENOUS
  Filled 2019-06-13: qty 1
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
## 2019-06-13 NOTE — ED Notes (Signed)
To x-ray
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
## 2019-06-14 DIAGNOSIS — Z91041 Radiographic dye allergy status: Secondary | ICD-10-CM | POA: Diagnosis not present

## 2019-06-14 DIAGNOSIS — J45909 Unspecified asthma, uncomplicated: Secondary | ICD-10-CM | POA: Diagnosis present

## 2019-06-14 DIAGNOSIS — S32019A Unspecified fracture of first lumbar vertebra, initial encounter for closed fracture: Secondary | ICD-10-CM | POA: Diagnosis present

## 2019-06-14 DIAGNOSIS — F17210 Nicotine dependence, cigarettes, uncomplicated: Secondary | ICD-10-CM | POA: Diagnosis present

## 2019-06-14 DIAGNOSIS — I129 Hypertensive chronic kidney disease with stage 1 through stage 4 chronic kidney disease, or unspecified chronic kidney disease: Secondary | ICD-10-CM | POA: Diagnosis present

## 2019-06-14 DIAGNOSIS — Z91013 Allergy to seafood: Secondary | ICD-10-CM | POA: Diagnosis not present

## 2019-06-14 DIAGNOSIS — S32029A Unspecified fracture of second lumbar vertebra, initial encounter for closed fracture: Secondary | ICD-10-CM | POA: Diagnosis present

## 2019-06-14 DIAGNOSIS — S32039A Unspecified fracture of third lumbar vertebra, initial encounter for closed fracture: Secondary | ICD-10-CM | POA: Diagnosis present

## 2019-06-14 DIAGNOSIS — M549 Dorsalgia, unspecified: Secondary | ICD-10-CM | POA: Diagnosis present

## 2019-06-14 DIAGNOSIS — Z7982 Long term (current) use of aspirin: Secondary | ICD-10-CM | POA: Diagnosis not present

## 2019-06-14 DIAGNOSIS — Z79899 Other long term (current) drug therapy: Secondary | ICD-10-CM | POA: Diagnosis not present

## 2019-06-14 DIAGNOSIS — N189 Chronic kidney disease, unspecified: Secondary | ICD-10-CM | POA: Diagnosis present

## 2019-06-14 DIAGNOSIS — K5732 Diverticulitis of large intestine without perforation or abscess without bleeding: Secondary | ICD-10-CM | POA: Diagnosis present

## 2019-06-14 DIAGNOSIS — Z20828 Contact with and (suspected) exposure to other viral communicable diseases: Secondary | ICD-10-CM | POA: Diagnosis present

## 2019-06-14 DIAGNOSIS — R319 Hematuria, unspecified: Secondary | ICD-10-CM | POA: Diagnosis not present

## 2019-06-14 DIAGNOSIS — Y92410 Unspecified street and highway as the place of occurrence of the external cause: Secondary | ICD-10-CM | POA: Diagnosis not present

## 2019-06-14 DIAGNOSIS — S065X9A Traumatic subdural hemorrhage with loss of consciousness of unspecified duration, initial encounter: Secondary | ICD-10-CM | POA: Diagnosis present

## 2019-06-14 DIAGNOSIS — S066X9A Traumatic subarachnoid hemorrhage with loss of consciousness of unspecified duration, initial encounter: Secondary | ICD-10-CM | POA: Diagnosis present

## 2019-06-14 LAB — CBC
HCT: 44.6 % (ref 39.0–52.0)
Hemoglobin: 13.7 g/dL (ref 13.0–17.0)
MCH: 27.8 pg (ref 26.0–34.0)
MCHC: 30.7 g/dL (ref 30.0–36.0)
MCV: 90.5 fL (ref 80.0–100.0)
Platelets: 215 10*3/uL (ref 150–400)
RBC: 4.93 MIL/uL (ref 4.22–5.81)
RDW: 16.6 % — ABNORMAL HIGH (ref 11.5–15.5)
WBC: 13.3 10*3/uL — ABNORMAL HIGH (ref 4.0–10.5)
nRBC: 0 % (ref 0.0–0.2)

## 2019-06-14 LAB — BASIC METABOLIC PANEL
Anion gap: 9 (ref 5–15)
BUN: 19 mg/dL (ref 8–23)
CO2: 26 mmol/L (ref 22–32)
Calcium: 8.9 mg/dL (ref 8.9–10.3)
Chloride: 104 mmol/L (ref 98–111)
Creatinine, Ser: 1.28 mg/dL — ABNORMAL HIGH (ref 0.61–1.24)
GFR calc Af Amer: >60 mL/min (ref >60)
GFR calc non Af Amer: 56 mL/min — ABNORMAL LOW (ref >60)
Glucose, Bld: 123 mg/dL — ABNORMAL HIGH (ref 70–99)
Potassium: 4.1 mmol/L (ref 3.5–5.1)
Sodium: 139 mmol/L (ref 135–145)

## 2019-06-14 LAB — SARS CORONAVIRUS 2 BY RT PCR (HOSPITAL ORDER, PERFORMED IN ~~LOC~~ HOSPITAL LAB): SARS Coronavirus 2: NEGATIVE

## 2019-06-14 LAB — RAPID URINE DRUG SCREEN, HOSP PERFORMED
Amphetamines: NOT DETECTED
Barbiturates: NOT DETECTED
Benzodiazepines: NOT DETECTED
Cocaine: NOT DETECTED
Opiates: NOT DETECTED
Tetrahydrocannabinol: POSITIVE — AB

## 2019-06-14 LAB — MRSA PCR SCREENING: MRSA by PCR: NEGATIVE

## 2019-06-14 MED ORDER — HYDROCHLOROTHIAZIDE 25 MG PO TABS
12.5000 mg | ORAL_TABLET | Freq: Every day | ORAL | Status: DC | PRN
  Administered 2019-06-17: 12.5 mg via ORAL
  Filled 2019-06-14 (×2): qty 1

## 2019-06-14 MED ORDER — OXYCODONE HCL 5 MG PO TABS
10.0000 mg | ORAL_TABLET | Freq: Four times a day (QID) | ORAL | Status: DC | PRN
  Administered 2019-06-14 – 2019-06-15 (×3): 10 mg via ORAL
  Filled 2019-06-14 (×3): qty 2

## 2019-06-14 MED ORDER — LACTATED RINGERS IV SOLN
INTRAVENOUS | Status: DC
  Administered 2019-06-14 (×2): via INTRAVENOUS

## 2019-06-14 MED ORDER — ACETAMINOPHEN 325 MG PO TABS
650.0000 mg | ORAL_TABLET | Freq: Four times a day (QID) | ORAL | Status: DC
  Administered 2019-06-14 – 2019-06-19 (×20): 650 mg via ORAL
  Filled 2019-06-14 (×20): qty 2

## 2019-06-14 MED ORDER — DOCUSATE SODIUM 100 MG PO CAPS
100.0000 mg | ORAL_CAPSULE | Freq: Two times a day (BID) | ORAL | Status: DC
  Administered 2019-06-14 – 2019-06-19 (×12): 100 mg via ORAL
  Filled 2019-06-14 (×12): qty 1

## 2019-06-14 MED ORDER — SODIUM CHLORIDE 0.9 % IV SOLN
INTRAVENOUS | Status: DC | PRN
  Administered 2019-06-14: 500 mL via INTRAVENOUS
  Administered 2019-06-15: 1000 mL via INTRAVENOUS

## 2019-06-14 MED ORDER — PIPERACILLIN-TAZOBACTAM 3.375 G IVPB
3.3750 g | Freq: Three times a day (TID) | INTRAVENOUS | Status: DC
  Administered 2019-06-14: 3.375 g via INTRAVENOUS
  Filled 2019-06-14: qty 50

## 2019-06-14 MED ORDER — ONDANSETRON HCL 4 MG/2ML IJ SOLN: 4.0000 mg | Freq: Four times a day (QID) | INTRAMUSCULAR | Status: DC | PRN

## 2019-06-14 MED ORDER — HYDROMORPHONE HCL 1 MG/ML IJ SOLN
0.5000 mg | INTRAMUSCULAR | Status: DC | PRN
  Administered 2019-06-16 – 2019-06-19 (×2): 0.5 mg via INTRAVENOUS
  Filled 2019-06-14 (×2): qty 1

## 2019-06-14 MED ORDER — HYDROMORPHONE HCL 1 MG/ML IJ SOLN
1.0000 mg | Freq: Once | INTRAMUSCULAR | Status: AC
  Administered 2019-06-14: 1 mg via INTRAVENOUS
  Filled 2019-06-14: qty 1

## 2019-06-14 MED ORDER — HYDRALAZINE HCL 20 MG/ML IJ SOLN
10.0000 mg | INTRAMUSCULAR | Status: AC | PRN
  Administered 2019-06-15 – 2019-06-16 (×4): 10 mg via INTRAVENOUS
  Filled 2019-06-14 (×4): qty 1

## 2019-06-14 MED ORDER — ONDANSETRON 4 MG PO TBDP: 4.0000 mg | ORAL_TABLET | Freq: Four times a day (QID) | ORAL | Status: DC | PRN

## 2019-06-14 MED ORDER — CIPROFLOXACIN HCL 500 MG PO TABS
500.0000 mg | ORAL_TABLET | Freq: Two times a day (BID) | ORAL | Status: DC
  Administered 2019-06-14 – 2019-06-19 (×11): 500 mg via ORAL
  Filled 2019-06-14 (×13): qty 1

## 2019-06-14 MED ORDER — METRONIDAZOLE 500 MG PO TABS
500.0000 mg | ORAL_TABLET | Freq: Three times a day (TID) | ORAL | Status: DC
  Administered 2019-06-14 – 2019-06-19 (×16): 500 mg via ORAL
  Filled 2019-06-14 (×18): qty 1

## 2019-06-14 NOTE — ED Notes (Signed)
Dilaudid 1mg  given  0105 unable to scan doctor on computer
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
## 2019-06-14 NOTE — Progress Notes (Signed)
Pharmacy Antibiotic Note  Bradley Silva is a 73 y.o. male admitted on 06/13/2019 with intraabdominal infections.  Pharmacy has been consulted for Zosyn dosing.  Plan: Zosyn 3.375g IV EI q8h Monitor Cr, LOT, cultures   Height: 5\' 7"  (170.2 cm) Weight: 175 lb 0.7 oz (79.4 kg) IBW/kg (Calculated) : 66.1  Temp (24hrs), Avg:98.3 F (36.8 C), Min:98.3 F (36.8 C), Max:98.3 F (36.8 C)  Recent Labs  Lab 06/13/19 2227  WBC 11.5*  CREATININE 1.35*    Estimated Creatinine Clearance: 50.7 mL/min (A) (by C-G formula based on SCr of 1.35 mg/dL (H)).    Allergies  Allergen Reactions  . Ivp Dye [Iodinated Diagnostic Agents] Anaphylaxis  . Shellfish Allergy Rash    Antimicrobials this admission: Zosyn 7/23 >>  Microbiology results: none  Thank you for allowing pharmacy to be a part of this patient's care.   Arrie Senate, PharmD, BCPS Clinical Pharmacist Please check AMION for all [facility] Pharmacy numbers 06/14/2019

## 2019-06-14 NOTE — ED Notes (Signed)
Pt rerturned from xray pain is better

## 2019-06-14 NOTE — ED Notes (Signed)
Dr white at the bedside

## 2019-06-14 NOTE — Progress Notes (Signed)
Patient sister Ivin Booty called per patient request. Contact information added to patient chart. RN will continue to monitor.

## 2019-06-14 NOTE — ED Notes (Signed)
The pt is in much pain nasal 02 at 2 applied after his dialudid that dropped his pulse ox in the low 90s  The pt is sl confused about the month he thinks its august  Not sure of week day
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
## 2019-06-14 NOTE — Progress Notes (Signed)
Pt. Belongings at bedside include 1 black cell phone, 1 shoe, 1 inhaler, 1 black shirt, 1 belt, 1 pair black jeans, 1 earring, 1 silver cross, 1 walet including ID and several cards and bank card, and cash in pockets placed in wallet. 1 $10 bill, 2 $5 bill, 4 $1 bill 5 quarters, 1 dime, 4 nickels, 12 pennies. Loose change placed in pink container with 1 earring and 1 silver cross. Pt. Request to keep his belongings at his bedside at this time. RN educated pt. About option to lock up valuables with security. Pt. Declined at this time. RN will continue to monitor.

## 2019-06-15 ENCOUNTER — Inpatient Hospital Stay (HOSPITAL_COMMUNITY): Payer: Medicare HMO

## 2019-06-15 LAB — CBC
HCT: 40.2 % (ref 39.0–52.0)
Hemoglobin: 12.1 g/dL — ABNORMAL LOW (ref 13.0–17.0)
MCH: 27.3 pg (ref 26.0–34.0)
MCHC: 30.1 g/dL (ref 30.0–36.0)
MCV: 90.5 fL (ref 80.0–100.0)
Platelets: 200 10*3/uL (ref 150–400)
RBC: 4.44 MIL/uL (ref 4.22–5.81)
RDW: 16.4 % — ABNORMAL HIGH (ref 11.5–15.5)
WBC: 7.2 10*3/uL (ref 4.0–10.5)
nRBC: 0 % (ref 0.0–0.2)

## 2019-06-15 LAB — COMPREHENSIVE METABOLIC PANEL
ALT: 54 U/L — ABNORMAL HIGH (ref 0–44)
AST: 123 U/L — ABNORMAL HIGH (ref 15–41)
Albumin: 3.2 g/dL — ABNORMAL LOW (ref 3.5–5.0)
Alkaline Phosphatase: 81 U/L (ref 38–126)
Anion gap: 8 (ref 5–15)
BUN: 15 mg/dL (ref 8–23)
CO2: 28 mmol/L (ref 22–32)
Calcium: 8.7 mg/dL — ABNORMAL LOW (ref 8.9–10.3)
Chloride: 100 mmol/L (ref 98–111)
Creatinine, Ser: 1.17 mg/dL (ref 0.61–1.24)
GFR calc Af Amer: >60 mL/min (ref >60)
GFR calc non Af Amer: >60 mL/min (ref >60)
Glucose, Bld: 115 mg/dL — ABNORMAL HIGH (ref 70–99)
Potassium: 4 mmol/L (ref 3.5–5.1)
Sodium: 136 mmol/L (ref 135–145)
Total Bilirubin: 0.6 mg/dL (ref 0.3–1.2)
Total Protein: 6.5 g/dL (ref 6.5–8.1)
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
## 2019-06-15 NOTE — Progress Notes (Signed)
Rehab Admissions Coordinator Note:  Patient was screened by Michel Santee for appropriateness for an Inpatient Acute Rehab Consult.  At this time, we are recommending Inpatient Rehab consult. I will page trauma PA for order.   Michel Santee 06/15/2019, 8:43 AM  I can be reached at [phone].

## 2019-06-15 NOTE — Progress Notes (Signed)
CSW met with patient via bedside to complete SBIRT- patient was pleasant and appropriate during phone call. Patient states his nephew lives with him currently however he does not think he needs much help at home and "does well for himself". 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 Patient voiced no concerns at this time and does not have any concerns for alcohol/ substance abuse.   Kingsley Spittle, LCSW [facility]  [phone]
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
## 2019-06-15 NOTE — Progress Notes (Signed)
Order in computer is for 4NProgressive, called [place], [place] and she OK'd for pt to be on 6N.  Nurse Remo Lipps agrees that pt is OK for 6N, bed control called to see if needed to change anything and their order shows 4NProgressive or 6N.
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
## 2019-06-15 NOTE — Progress Notes (Signed)
Inpatient Rehab Admissions:  Inpatient Rehab Consult received.  I met with patient at the bedside for rehabilitation assessment and to discuss goals and expectations of an inpatient rehab admission.  Will need to confirm some family support and obtain insurance authorization for possible admission next week.   Signed: Caitlin Warren, PT, DPT Admissions Coordinator [phone] 06/15/19  1:21 PM    
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
## 2019-06-16 NOTE — Progress Notes (Signed)
Overall stable.  Patient denies patient up at bedside eating breakfast.  Denies headache.  Motor and sensory examination stable.  Overall progressing well following traumatic brain injury.  Continue efforts at mobilization.  No new recommendations.

## 2019-06-16 NOTE — Plan of Care (Signed)
  Problem: Education: Goal: Knowledge of General Education information will improve Description: Including pain rating scale, medication(s)/side effects and non-pharmacologic comfort measures Outcome: Progressing   Problem: Health Behavior/Discharge Planning: Goal: Ability to manage health-related needs will improve Outcome: Progressing   Problem: Clinical Measurements: Goal: Ability to maintain clinical measurements within normal limits will improve Outcome: Progressing Goal: Will remain free from infection Outcome: Progressing Goal: Respiratory complications will improve Outcome: Progressing   Problem: Activity: Goal: Risk for activity intolerance will decrease Outcome: Progressing   Problem: Nutrition: Goal: Adequate nutrition will be maintained Outcome: Progressing   Problem: Coping: Goal: Level of anxiety will decrease Outcome: Progressing   Problem: Elimination: Goal: Will not experience complications related to bowel motility Outcome: Progressing Goal: Will not experience complications related to urinary retention Outcome: Progressing   Problem: Pain Managment: Goal: General experience of comfort will improve Outcome: Progressing   Problem: Safety: Goal: Ability to remain free from injury will improve Outcome: Progressing   Problem: Skin Integrity: Goal: Risk for impaired skin integrity will decrease Outcome: Progressing   
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
## 2019-06-18 NOTE — Care Management Important Message (Signed)
Important Message  Patient Details  Name: Bradley Silva MRN: [id number] Date of Birth: 04-Mar-1946   Medicare Important Message Given:  Yes     Memory Argue 06/18/2019, 3:32 PM    SIGNED
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
## 2019-06-19 LAB — CBC
HCT: 41.8 % (ref 39.0–52.0)
Hemoglobin: 12.9 g/dL — ABNORMAL LOW (ref 13.0–17.0)
MCH: 27.2 pg (ref 26.0–34.0)
MCHC: 30.9 g/dL (ref 30.0–36.0)
MCV: 88 fL (ref 80.0–100.0)
Platelets: 228 10*3/uL (ref 150–400)
RBC: 4.75 MIL/uL (ref 4.22–5.81)
RDW: 16.4 % — ABNORMAL HIGH (ref 11.5–15.5)
WBC: 5.3 10*3/uL (ref 4.0–10.5)
nRBC: 0 % (ref 0.0–0.2)

## 2019-06-19 MED ORDER — OXYCODONE HCL 5 MG PO TABS: 5.0000 mg | ORAL_TABLET | ORAL | 0 refills | Status: DC | PRN

## 2019-06-19 MED ORDER — CIPROFLOXACIN HCL 500 MG PO TABS: 500.0000 mg | ORAL_TABLET | Freq: Two times a day (BID) | ORAL | 0 refills | Status: AC

## 2019-06-19 MED ORDER — ACETAMINOPHEN 325 MG PO TABS: 650.0000 mg | ORAL_TABLET | Freq: Four times a day (QID) | ORAL | Status: AC | PRN

## 2019-06-19 MED ORDER — METRONIDAZOLE 500 MG PO TABS: 500.0000 mg | ORAL_TABLET | Freq: Three times a day (TID) | ORAL | 0 refills | Status: AC
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
## 2019-06-19 NOTE — Progress Notes (Signed)
OT Cancellation Note  Patient Details Name: Bradley Silva MRN: [id number] DOB: January 07, 1946   Cancelled Treatment:    Reason Eval/Treat Not Completed: Other (comment) Multiple attempts made to see pt this date. This AM pt declined, being somewhat inappropriate with OT in comments, ultimately non agreeable to work with OT despite education, asked OT to come back later. Attempted again in PM, pt in shower by himself with RN aware- shutting door and wanting OT to leave. Anticipate d/c this date but will continue to follow as needed in acute.   Zenovia Jarred, MSOT, OTR/L Behavioral Health OT/ Acute Relief OT [facility] Office: [place] 06/19/2019, 4:33 PM

## 2019-06-19 NOTE — Plan of Care (Signed)
  Problem: Education: Goal: Knowledge of the prescribed therapeutic regimen Outcome: Completed/Met Goal: Knowledge of disease or condition will improve Outcome: Completed/Met   Problem: Clinical Measurements: Goal: Neurologic status will improve Outcome: Completed/Met   Problem: Tissue Perfusion: Goal: Ability to maintain intracranial pressure will improve Outcome: Completed/Met   Problem: Respiratory: Goal: Will regain and/or maintain adequate ventilation Outcome: Completed/Met   Problem: Skin Integrity: Goal: Risk for impaired skin integrity will decrease Outcome: Completed/Met Goal: Demonstration of wound healing without infection will improve Outcome: Completed/Met   Problem: Psychosocial: Goal: Ability to verbalize positive feelings about self will improve Outcome: Completed/Met Goal: Ability to participate in self-care as condition permits will improve Outcome: Completed/Met Goal: Ability to identify appropriate support needs will improve Outcome: Completed/Met   Problem: Health Behavior/Discharge Planning: Goal: Ability to manage health-related needs will improve Outcome: Completed/Met   Problem: Nutritional: Goal: Risk of aspiration will decrease Outcome: Completed/Met Goal: Dietary intake will improve Outcome: Completed/Met   Problem: Communication: Goal: Ability to communicate needs accurately will improve Outcome: Completed/Met   Problem: Education: Goal: Knowledge of secondary prevention will improve Outcome: Completed/Met   Problem: Intracerebral Hemorrhage Tissue Perfusion: Goal: Complications of Intracerebral Hemorrhage will be minimized Outcome: Completed/Met   Problem: Spontaneous Subarachnoid Hemorrhage Tissue Perfusion: Goal: Complications of Spontaneous Subarachnoid Hemorrhage will be minimized Outcome: Completed/Met   Problem: Education: Goal: Knowledge of General Education information will improve Description: Including pain rating  scale, medication(s)/side effects and non-pharmacologic comfort measures Outcome: Completed/Met   Problem: Health Behavior/Discharge Planning: Goal: Ability to manage health-related needs will improve Outcome: Completed/Met   Problem: Clinical Measurements: Goal: Ability to maintain clinical measurements within normal limits will improve Outcome: Completed/Met Goal: Will remain free from infection Outcome: Completed/Met Goal: Diagnostic test results will improve Outcome: Completed/Met Goal: Respiratory complications will improve Outcome: Completed/Met Goal: Cardiovascular complication will be avoided Outcome: Completed/Met   Problem: Activity: Goal: Risk for activity intolerance will decrease Outcome: Completed/Met   Problem: Nutrition: Goal: Adequate nutrition will be maintained Outcome: Completed/Met   Problem: Coping: Goal: Level of anxiety will decrease Outcome: Completed/Met   Problem: Elimination: Goal: Will not experience complications related to bowel motility Outcome: Completed/Met Goal: Will not experience complications related to urinary retention Outcome: Completed/Met   Problem: Pain Managment: Goal: General experience of comfort will improve Outcome: Completed/Met   Problem: Safety: Goal: Ability to remain free from injury will improve Outcome: Completed/Met   Problem: Skin Integrity: Goal: Risk for impaired skin integrity will decrease Outcome: Completed/Met

## 2019-06-19 NOTE — Progress Notes (Signed)
Discharge home. Home discharge instruction given,HHneeds addressed by CM.
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
## 2019-06-19 NOTE — Discharge Instructions (Signed)
Subdural Hematoma  A subdural hematoma is a collection of blood between the brain and its outer covering (dura). As the amount of blood increases, pressure builds on the brain. There are two types of subdural hematomas:  Acute. This type develops shortly after a hard, direct hit to the head and causes blood to collect very quickly. This is a medical emergency. If it is not diagnosed and treated quickly, it can lead to severe brain injury or death.  Chronic. This is when bleeding develops more slowly, over weeks or months. In some cases, this type does not cause symptoms. What are the causes? This condition is caused by bleeding (hemorrhage) from a broken (ruptured) blood vessel. In most cases, a blood vessel ruptures and bleeds because of a head injury, such as from a hard, direct hit. Head injuries can happen in car accidents, falls, assaults, or while playing sports. In rare cases, a hemorrhage can happen without a known cause (spontaneously), especially if you take blood thinners (anticoagulants). What increases the risk? This condition is more likely to develop in:  Older people.  Infants.  People who take blood thinners.  People who have head injuries.  People who abuse alcohol. What are the signs or symptoms? Symptoms of this condition can vary depending on the size of the hematoma. Symptoms can be mild, severe, or life-threatening. They include:  Headaches.  Nausea or vomiting.  Changes in vision, such as double vision or loss of vision.  Changes in speech or trouble understanding what people say.  Loss of balance or trouble walking.  Weakness, numbness, or tingling in the arms or legs, especially on one side of the body.  Seizures.  Change in personality.  Increased sleepiness.  Memory loss.  Loss of consciousness.  Coma. Symptoms of acute subdural hematoma can develop over minutes or hours. Symptoms of chronic subdural hematoma may develop over weeks or  months. How is this diagnosed? This condition is diagnosed based on the results of:  A physical exam.  Tests of strength, reflexes, coordination, senses, manner of walking (gait), and facial and eye movements (neurological exam).  Imaging tests, such as an MRI or a CT scan. How is this treated? Treatment for this condition depends on the type of hematoma and how severe it is. Treatment for acute hematoma may include:  Emergency surgery to drain blood or remove a blood clot.  Medicines that help the body get rid of excess fluids (diuretics). These may help to reduce pressure in the brain.  Assisted breathing (ventilation). Treatment for chronic hematoma may include:  Observation and bed rest at the hospital.  Surgery. If you take blood thinners, you may need to stop taking them for a short time. You may also be given anti-seizure (anticonvulsant) medicine. Sometimes, no treatment is needed for chronic subdural hematoma. Follow these instructions at home: Activity  Avoid situations where you could injure your head again, such as in competitive sports, downhill snow sports, and horseback riding. Do not do these activities until your health care provider approves. ? Wear protective gear, such as a helmet, when participating in activities such as biking or contact sports.  Avoid too much visual stimulation while recovering. This means limiting how much you read and limiting your screen time on a smart phone, tablet, computer, or TV.  Rest as told by your health care provider. Rest helps the brain heal.  Try to avoid activities that cause physical or mental stress. Return to work or school as told by  your health care provider.  Do not lift anything that is heavier than 5 lb (2.3 kg), or the limit you are told, until your health care provider says that it is safe.  Do not drive, ride a bike, or use heavy machinery until your health care provider approves.  Always wear your seat belt  when you are in a motor vehicle. Alcohol use  Do not drink alcohol if your health care provider tells you not to drink.  If you drink alcohol, limit how much you use to: ? 0-1 drink a day for women. ? 0-2 drinks a day for men. General instructions  Monitor your symptoms, and ask people around you to do the same. Recovery from brain injuries varies. Talk with your health care provider about what to expect.  Take over-the-counter and prescription medicines only as told by your health care provider. Do not take blood thinners or NSAIDs unless your health care provider approves. These include aspirin, ibuprofen, naproxen, and warfarin.  Keep your home environment safe to reduce the risk of falling.  Keep all follow-up visits as told by your health care provider. This is important. Where to find more information  [employer] of Neurological Disorders and Stroke: MasterBoxes.it  American Academy of Neurology (AAN): http://keith.biz/  Brain Injury Association of [place]: www.biausa.org Get help right away if you:  Are taking blood thinners and you fall or you experience minor trauma to the head. If you take any blood thinners, even a very small injury can cause a subdural hematoma.  Have a bleeding disorder and you fall or you experience minor trauma to the head.  Develop any of the following symptoms after a head injury: ? Clear fluid draining from your nose or ears. ? Nausea or vomiting. ? Changes in speech or trouble understanding what people say. ? Seizures. ? Drowsiness or a decrease in alertness. ? Double vision. ? Numbness or inability to move (paralysis) in any part of your body. ? Difficulty walking or poor coordination. ? Difficulty thinking. ? Confusion or forgetfulness. ? Personality changes. ? Irrational or aggressive behavior. These symptoms may represent a serious problem that is an emergency. Do not wait to see if the symptoms will go away. Get medical help  right away. Call your local emergency services (911 in the [place] Do not drive yourself to the hospital. Summary  A subdural hematoma is a collection of blood between the brain and its outer covering (dura).  Treatment for this condition depends on what type of subdural hematoma you have and how severe it is.  Symptoms can vary from mild to severe to life-threatening.  Monitor your symptoms, and ask others around you to do the same. This information is not intended to replace advice given to you by your health care provider. Make sure you discuss any questions you have with your health care provider. Document Released: 09/25/2004 Document Revised: 10/09/2018 Document Reviewed: 10/09/2018 Elsevier Patient Education  2020 [place].   Subarachnoid Hemorrhage  Subarachnoid hemorrhage is bleeding between the brain and the layer that covers the brain. The bleeding puts pressure on the brain, and it stops blood from going to some areas of the brain. If this bleeding is not treated, it may cause brain damage, stroke, or death. This is an emergency. You must be treated in the hospital right away. You are more likely to get this condition if you:  Smoke.  Have high blood pressure.  Drink too much alcohol.  Are older than age  31.  Are male, especially if you have stopped getting your period for a year or longer (menopause).  Have a family history of burst blood vessels (aneurysms).  Have a certain syndrome that leads to one of these: ? Kidney disease. ? Disease of tissues like bones, blood, and fat (connective tissues). Signs of this bleeding condition include:  Sudden, very bad headache. It may feel like the worst headache you have ever had.  Feeling sick to your stomach (nausea) or throwing up (vomiting), especially if you have other signs such as a headache.  Sudden weakness or loss of feeling (numbness) in your face, arm, or leg, especially on one side of the body.  Sudden  trouble with any of these: ? Walking. ? Moving an arm or leg. ? Talking. ? Understanding what people say. ? Swallowing. ? Seeing out of one eye or both eyes.  Sudden confusion.  Seeing double.  Loss of balance.  Sensitivity to light.  Stiff neck.  Trouble staying awake.  Passing out (fainting). Follow these instructions at home: Medicines  Take over-the-counter and prescription medicines only as told by your doctor.  Do not take any medicines that contain aspirin or NSAIDs (like ibuprofen) unless your doctor says that it is safe to take them. Lifestyle  Do not use any products that have nicotine or tobacco. These include cigarettes and e-cigarettes. If you need help quitting, ask your doctor.  Limit alcohol to 1 drink a day for nonpregnant women and 2 drinks a day for men. One drink is equal to: ? 12 oz of beer. ? 5 oz of wine. ? 1 oz of hard liquor. Eating and drinking  Ask your doctor if it is safe for you to eat and drink. You may need tests to make sure that you can swallow safely (swallow studies). Driving  Do not drive until your doctor says that it is safe to drive.  Do not drive or use heavy machinery while taking prescription pain medicine. General instructions  Do therapy as recommended. This may include: ? Physical therapy (PT). ? Occupational therapy (OT). ? Speech-language therapy.  Rest and limit activity as told by your doctor. Rest helps your brain to heal. Make sure you: ? Get plenty of sleep. ? Avoid activities that cause stress to your body or mind.  Check your blood pressure as told by your doctor. Write down your blood pressure.  Keep all follow-up visits as told by your doctors. This is important. Contact a doctor if:  You have a stiff neck.  You have a cough.  You have a fever. Get help right away if:  You have any signs of a stroke. "BE FAST" is an easy way to remember the main warning signs: ? B - Balance. Signs are  dizziness, sudden trouble walking, or loss of balance. ? E - Eyes. Signs are trouble seeing or a sudden change in how you see. ? F - Face. Signs are sudden weakness or loss of feeling of the face, or the face or eyelid drooping on one side. ? A - Arms. Signs are weakness or loss of feeling in an arm. This happens suddenly and usually on one side of the body. ? S - Speech. Signs are sudden trouble speaking, slurred speech, or trouble understanding what people say. ? T - Time. Time to call emergency services. Write down what time symptoms started.  You have other signs of a stroke, such as: ? A sudden, very bad headache with no  known cause. ? Feeling sick to your stomach. ? Throwing up. ? Jerky movements you cannot control (seizure). These symptoms may be an emergency. Do not wait to see if the symptoms will go away. Get medical help right away. Call your local emergency services (911 in the [place] Do not drive yourself to the hospital. Summary  Subarachnoid hemorrhage is bleeding in the brain. It is an emergency. You must be treated in the hospital right away.  Follow instructions from your doctor about eating, resting, and taking medicines.  Do not take any medicines that contain aspirin or NSAIDs (like ibuprofen) unless your doctor says that it is safe to take them. This information is not intended to replace advice given to you by your health care provider. Make sure you discuss any questions you have with your health care provider. Document Released: 03/05/2013 Document Revised: 10/21/2017 Document Reviewed: 08/18/2017 Elsevier Patient Education  [place].   Diverticulitis  Diverticulitis is when small pockets in your large intestine (colon) get infected or swollen. This causes stomach pain and watery poop (diarrhea). These pouches are called diverticula. They form in people who have a condition called diverticulosis. Follow these instructions at home: Medicines  Take  over-the-counter and prescription medicines only as told by your doctor. These include: ? Antibiotics. ? Pain medicines. ? Fiber pills. ? Probiotics. ? Stool softeners.  Do not drive or use heavy machinery while taking prescription pain medicine.  If you were prescribed an antibiotic, take it as told. Do not stop taking it even if you feel better. General instructions   Follow a diet as told by your doctor.  When you feel better, your doctor may tell you to change your diet. You may need to eat a lot of fiber. Fiber makes it easier to poop (have bowel movements). Healthy foods with fiber include: ? Berries. ? Beans. ? Lentils. ? Green vegetables.  Exercise 3 or more times a week. Aim for 30 minutes each time. Exercise enough to sweat and make your heart beat faster.  Keep all follow-up visits as told. This is important. You may need to have an exam of the large intestine. This is called a colonoscopy. Contact a doctor if:  Your pain does not get better.  You have a hard time eating or drinking.  You are not pooping like normal. Get help right away if:  Your pain gets worse.  Your problems do not get better.  Your problems get worse very fast.  You have a fever.  You throw up (vomit) more than one time.  You have poop that is: ? Bloody. ? Black. ? Tarry. Summary  Diverticulitis is when small pockets in your large intestine (colon) get infected or swollen.  Take medicines only as told by your doctor.  Follow a diet as told by your doctor. This information is not intended to replace advice given to you by your health care provider. Make sure you discuss any questions you have with your health care provider. Document Released: 04/26/2008 Document Revised: 10/21/2017 Document Reviewed: 11/25/2016 Elsevier Patient Education  [place].   Low-Fiber Eating Plan x 6 weeks Fiber is found in fruits, vegetables, whole grains, and beans. Eating a diet low in  fiber helps to reduce how often you have bowel movements and how much you produce during a bowel movement. A low-fiber eating plan may help your digestive system heal if:  You have certain conditions, such as Crohn's disease or diverticulitis.  You recently  had radiation therapy on your pelvis or bowel.  You recently had intestinal surgery.  You have a new surgical opening in your abdomen (colostomy or ileostomy).  Your intestine is narrowed (stricture). Your health care provider will determine how long you need to stay on this diet. Your health care provider may recommend that you work with a diet and nutrition specialist (dietitian). What are tips for following this plan? General guidelines  Follow recommendations from your dietitian about how much fiber you should have each day.  Most people on this eating plan should try to eat less than 10 grams (g) of fiber each day. Your daily fiber goal is _________________ g.  Take vitamin and mineral supplements as told by your health care provider or dietitian. Chewable or liquid forms are best when on this eating plan. Reading food labels  Check food labels for the amount of dietary fiber.  Choose foods that have less than 2 grams of fiber in one serving. Cooking  Use white flour and other allowed grains for baking and cooking.  Cook meat using methods that keep it tender, such as braising or poaching.  Cook eggs until the yolk is completely solid.  Cook with healthy oils, such as olive oil or canola oil. Meal planning   Eat 5-6 small meals throughout the day instead of 3 large meals.  If you are lactose intolerant: ? Choose low-lactose dairy foods. ? Do not eat dairy foods, if told by your dietitian.  Limit fat and oils to less than 8 teaspoons a day.  Eat small portions of desserts. What foods are allowed? The items listed below may not be a complete list. Talk with your dietitian about what dietary choices are best for  you. Grains All bread and crackers made with white flour. Waffles, pancakes, and [place] toast. Bagels. Pretzels. Melba toast, zwieback, and matzoh. Cooked and dried cereals that do not contain whole grains, added fiber, seeds, or dried fruit. CornmealDomenick Gong. Hot and cold cereals made with refined corn, wheat, rice, or oats. Plain pasta and noodles. White rice. Vegetables Well-cooked or canned vegetables without skin, seeds, or stems. Cooked potatoes without skins. Vegetable juice. Fruits Soft-cooked or canned fruits without skin and seeds. Peeled ripe banana. Applesauce. Fruit juice without pulp. Meats and other protein foods Ground meat. Tender cuts of meat or poultry. Eggs. Fish, seafood, and shellfish. Smooth nut butters. Tofu. Dairy All milk products and drinks. Lactose-free milks, including rice, soy, and almond milks. Yogurt without fruit, nuts, chocolate, or granola mix-ins. Sour cream. Cottage cheese. Cheese. Beverages Decaf coffee. Fruit and vegetable juices or smoothies (in small amounts, with no pulp or skins, and with fruits from allowed list). Sports drinks. Herbal tea. Fats and oils Olive oil, canola oil, sunflower oil, flaxseed oil, and grapeseed oil. Mayonnaise. Cream cheese. Margarine. Butter. Sweets and desserts Plain cakes and cookies. Cream pies and pies made with allowed fruits. Pudding. Custard. Fruit gelatin. Sherbet. Popsicles. Ice cream without nuts. Plain hard candy. Honey. Jelly. Molasses. Syrups, including chocolate syrup. Chocolate. Marshmallows. Gumdrops. Seasoning and other foods Bouillon. Broth. Cream soups made from allowed foods. Strained soup. Casseroles made with allowed foods. Ketchup. Mild mustard. Mild salad dressings. Plain gravies. Vinegar. Spices in moderation. Salt. Sugar. What foods are not allowed? The items listed below may not be a complete list. Talk with your dietitian about what dietary choices are best for you. Grains Whole wheat and whole  grain breads and crackers. Multigrain breads and crackers. Rye bread. Whole grain  or multigrain cereals. Cereals with nuts, raisins, or coconut. Bran. Coarse wheat cereals. Granola. High-fiber cereals. Cornmeal or corn bread. Whole grain pasta. Wild or brown rice. Quinoa. Popcorn. Buckwheat. Wheat germ. Vegetables Potato skins. Raw or undercooked vegetables. All beans and bean sprouts. Cooked greens. Corn. Peas. Cabbage. Beets. Broccoli. Brussels sprouts. Cauliflower. Mushrooms. Onions. Peppers. Parsnips. Okra. Sauerkraut. Fruit Raw or dried fruit. Berries. Fruit juice with pulp. Prune juice. Meats and other protein foods Tough, fibrous meats with gristle. Fatty meat. Poultry with skin. Fried meat, Sales executive, or fish. Deli or lunch meats. Sausage, bacon, and hot dogs. Nuts and chunky nut butter. Dried peas, beans, and lentils. Dairy Yogurt with fruit, nuts, chocolate, or granola mix-ins. Beverages Caffeinated coffee and teas. Fats and oils Avocado. Coconut. Sweets and desserts Desserts, cookies, or candies that contain nuts or coconut. Dried fruit. Jams and preserves with seeds. Marmalade. Any dessert made with fruits or grains that are not allowed. Seasoning and other foods Corn tortilla chips. Soups made with vegetables or grains that are not allowed. Relish. Horseradish. Angie Fava. Olives. Summary  Most people on a low-fiber eating plan should eat less than 10 grams of fiber a day. Follow recommendations from your dietitian about how much fiber you should have each day.  Always check food labels to see the dietary fiber content of packaged foods. In general, a low-fiber food will have fewer than 2 grams of fiber per serving.  In general, try to avoid whole grains, raw fruits and vegetables, dried fruit, tough cuts of meat, nuts, and seeds.  Take a vitamin and mineral supplement as told by your health care provider or dietitian. This information is not intended to replace advice given to you  by your health care provider. Make sure you discuss any questions you have with your health care provider. Document Released: 04/30/2002 Document Revised: 03/02/2019 Document Reviewed: 01/11/2017 Elsevier Patient Education  [place].   High-Fiber Diet Fiber, also called dietary fiber, is a type of carbohydrate that is found in fruits, vegetables, whole grains, and beans. A high-fiber diet can have many health benefits. Your health care provider may recommend a high-fiber diet to help:  Prevent constipation. Fiber can make your bowel movements more regular.  Lower your cholesterol.  Relieve the following conditions: ? Swelling of veins in the anus (hemorrhoids). ? Swelling and irritation (inflammation) of specific areas of the digestive tract (uncomplicated diverticulosis). ? A problem of the large intestine (colon) that sometimes causes pain and diarrhea (irritable bowel syndrome, IBS).  Prevent overeating as part of a weight-loss plan.  Prevent heart disease, type 2 diabetes, and certain cancers. What is my plan? The recommended daily fiber intake in grams (g) includes:  38 g for men age 73 or younger.  30 g for men over age 9.  76 g for women age 63 or younger.  21 g for women over age 76. You can get the recommended daily intake of dietary fiber by:  Eating a variety of fruits, vegetables, grains, and beans.  Taking a fiber supplement, if it is not possible to get enough fiber through your diet. What do I need to know about a high-fiber diet?  It is better to get fiber through food sources rather than from fiber supplements. There is not a lot of research about how effective supplements are.  Always check the fiber content on the nutrition facts label of any prepackaged food. Look for foods that contain 5 g of fiber or more per serving.  Talk with a diet and nutrition specialist (dietitian) if you have questions about specific foods that are recommended or not  recommended for your medical condition, especially if those foods are not listed below.  Gradually increase how much fiber you consume. If you increase your intake of dietary fiber too quickly, you may have bloating, cramping, or gas.  Drink plenty of water. Water helps you to digest fiber. What are tips for following this plan?  Eat a wide variety of high-fiber foods.  Make sure that half of the grains that you eat each day are whole grains.  Eat breads and cereals that are made with whole-grain flour instead of refined flour or white flour.  Eat brown rice, bulgur wheat, or millet instead of white rice.  Start the day with a breakfast that is high in fiber, such as a cereal that contains 5 g of fiber or more per serving.  Use beans in place of meat in soups, salads, and pasta dishes.  Eat high-fiber snacks, such as berries, raw vegetables, nuts, and popcorn.  Choose whole fruits and vegetables instead of processed forms like juice or sauce. What foods can I eat?  Fruits Berries. Pears. Apples. Oranges. Avocado. Prunes and raisins. Dried figs. Vegetables Sweet potatoes. Spinach. Kale. Artichokes. Cabbage. Broccoli. Cauliflower. Green peas. Carrots. Squash. Grains Whole-grain breads. Multigrain cereal. Oats and oatmeal. Brown rice. Barley. Bulgur wheat. [place]. Quinoa. Bran muffins. Popcorn. Rye wafer crackers. Meats and other proteins [place], kidney, and pinto beans. Soybeans. Split peas. Lentils. Nuts and seeds. Dairy Fiber-fortified yogurt. Beverages Fiber-fortified soy milk. Fiber-fortified orange juice. Other foods Fiber bars. The items listed above may not be a complete list of recommended foods and beverages. Contact a dietitian for more options. What foods are not recommended? Fruits Fruit juice. Cooked, strained fruit. Vegetables Fried potatoes. Canned vegetables. Well-cooked vegetables. Grains White bread. Pasta made with refined flour. White rice. Meats and  other proteins Fatty cuts of meat. Fried chicken or fried fish. Dairy Milk. Yogurt. Cream cheese. Sour cream. Fats and oils Butters. Beverages Soft drinks. Other foods Cakes and pastries. The items listed above may not be a complete list of foods and beverages to avoid. Contact a dietitian for more information. Summary  Fiber is a type of carbohydrate. It is found in fruits, vegetables, whole grains, and beans.  There are many health benefits of eating a high-fiber diet, such as preventing constipation, lowering blood cholesterol, helping with weight loss, and reducing your risk of heart disease, diabetes, and certain cancers.  Gradually increase your intake of fiber. Increasing too fast can result in cramping, bloating, and gas. Drink plenty of water while you increase your fiber.  The best sources of fiber include whole fruits and vegetables, whole grains, nuts, seeds, and beans. This information is not intended to replace advice given to you by your health care provider. Make sure you discuss any questions you have with your health care provider. Document Released: 11/08/2005 Document Revised: 09/12/2017 Document Reviewed: 09/12/2017 Elsevier Patient Education  2020 [employer].

## 2019-06-19 NOTE — Progress Notes (Signed)
Inpatient Rehab Admissions Coordinator:    Received denial for CIR authorization from pt's insurance.  Pt currently performing at min guard level with distances of up to 150'.  Pt continues to demo cognitive deficits, but is performing mobility at too high a level for CIR. Did find sister's phone number [phone]) and passed along to Saverio Danker, [place].  Pt could potentially benefit from home health therapies if he has adequate support from family at discharge, otherwise will need SNF.   Shann Medal, PT, DPT Admissions Coordinator [phone] 06/19/19  1:15 PM
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
## 2021-05-07 IMAGING — CT CT HEAD WITHOUT CONTRAST
4 series · 16 of 47 positions shown, 18 images · non-contrast
Comparison: Head CT 2 days ago

CLINICAL DATA: Follow-up head trauma

EXAM:
CT HEAD WITHOUT CONTRAST
TECHNIQUE: Contiguous axial images were obtained from the base of the skull
through the vertex without intravenous contrast.

[Series 3: head without · axial · non-contrast · 0.47mm/px · z∈[-84,+51]mm · 7 of 37 slices shown, 9 images]
[im 5/37  brain]
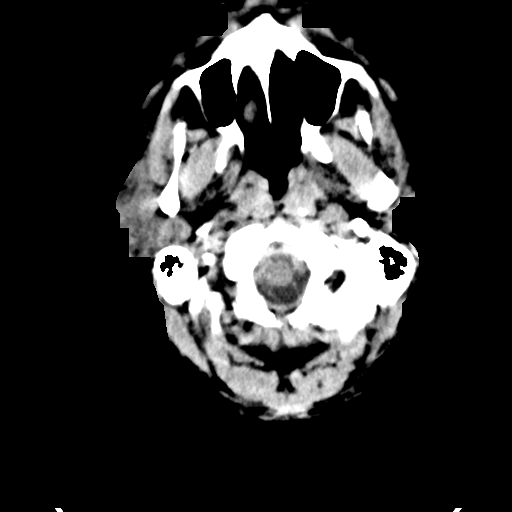
[im 5/37  bone]
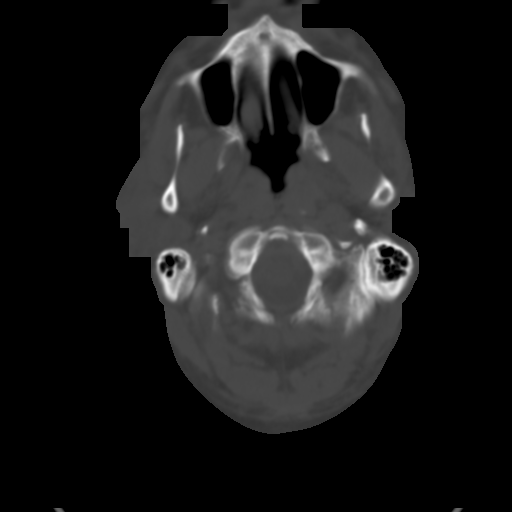
[im 10/37  brain]
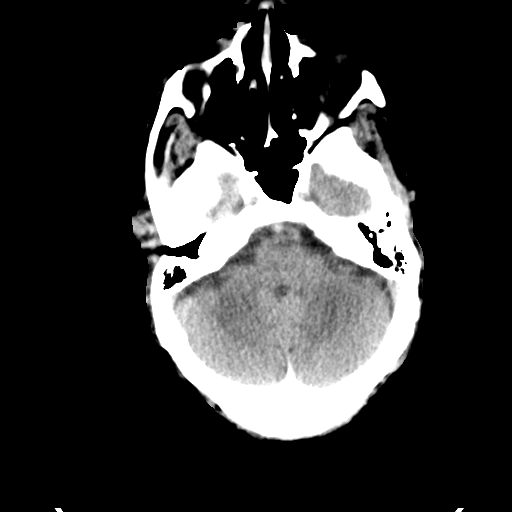
[im 14/37  brain]
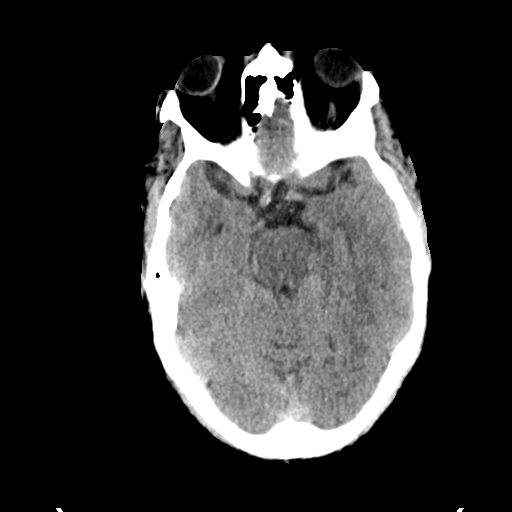
[im 19/37  brain]
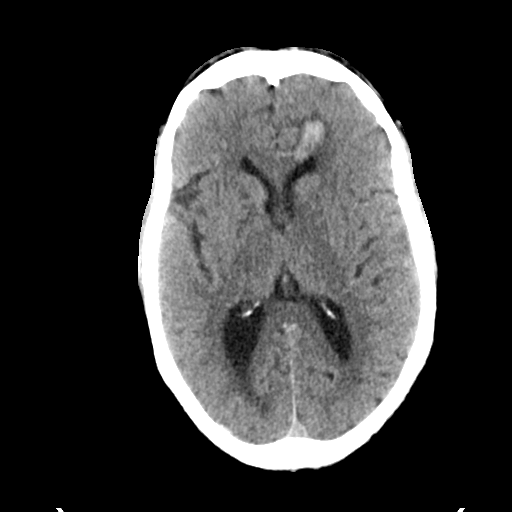
[im 23/37  brain]
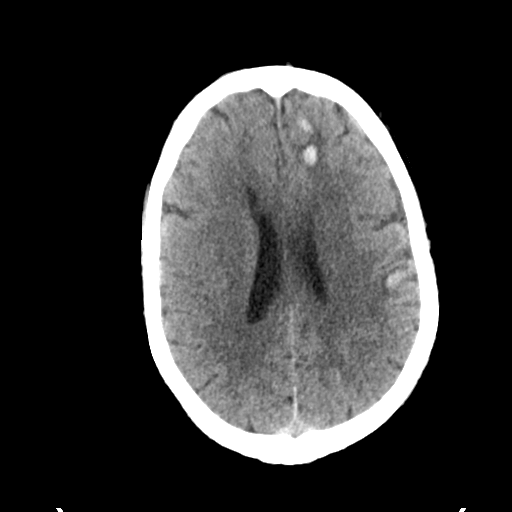
[im 23/37  bone]
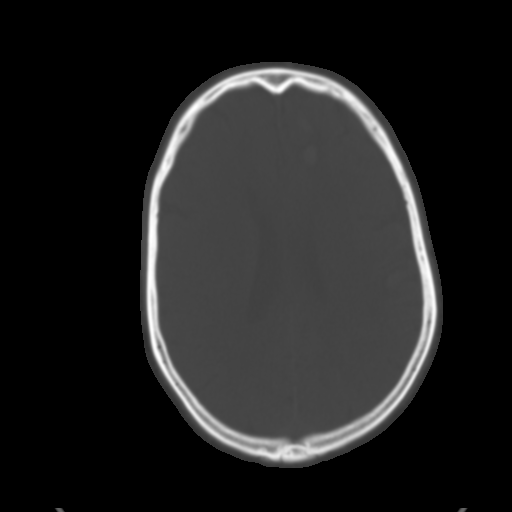
[im 28/37  brain]
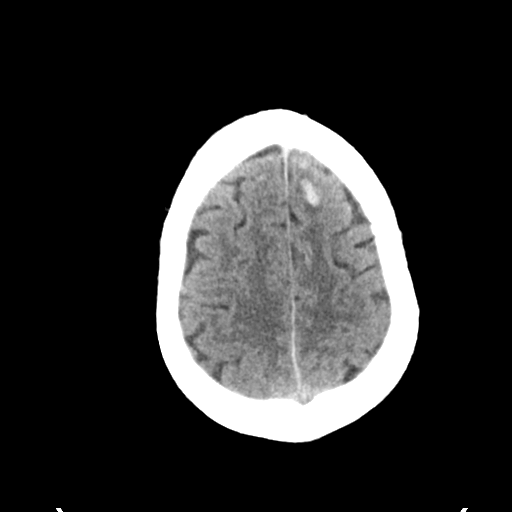
[im 32/37  brain]
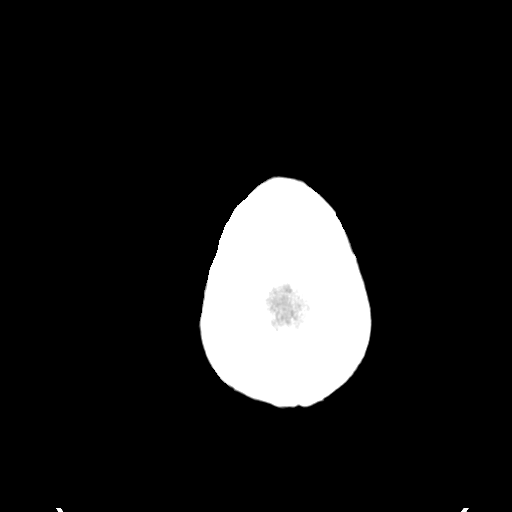

[Series 4: head bone · axial · 0.47mm/px · z∈[-86,-50]mm · 3 of 92 slices shown]
[im 10/92  bone]
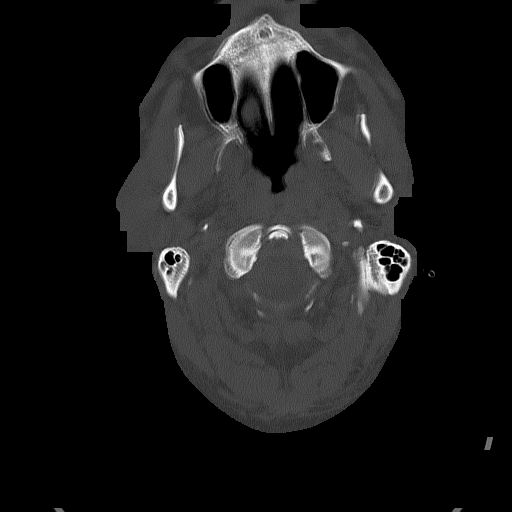
[im 19/92  bone]
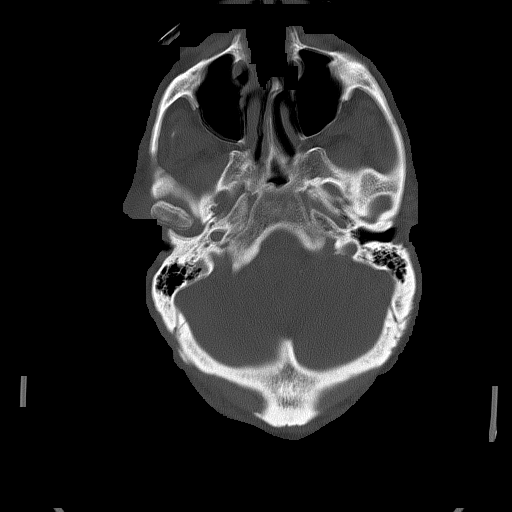
[im 28/92  bone]
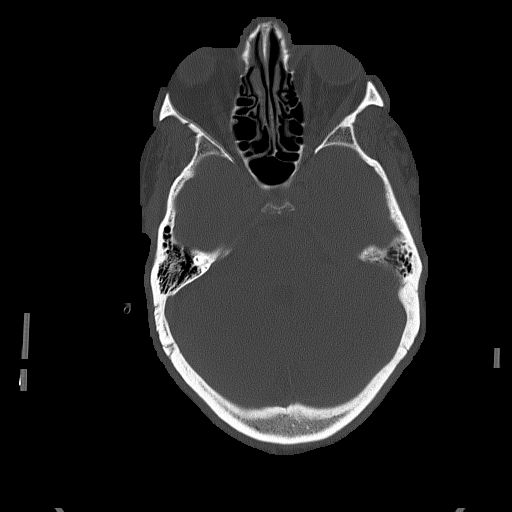

[Series 5: head without cor · coronal · non-contrast · 0.33mm/px · 3 of 78 slices shown]
[im 26/78  brain]
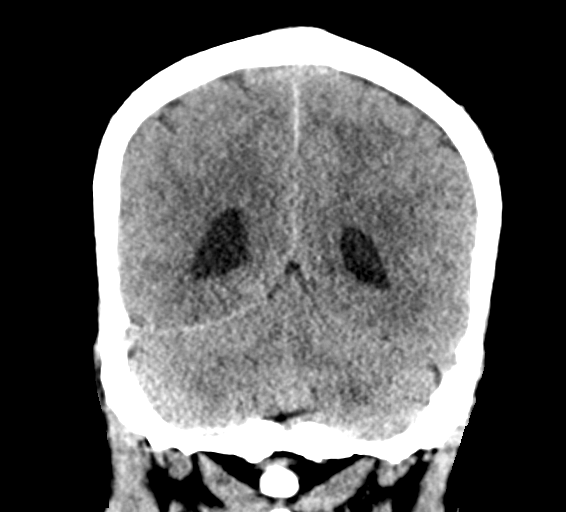
[im 35/78  brain]
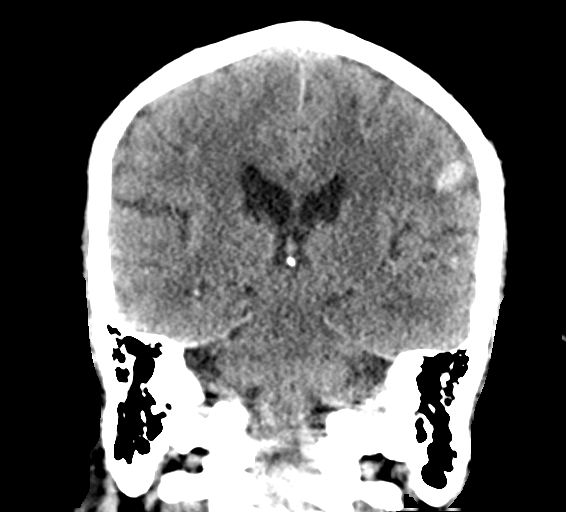
[im 43/78  brain]
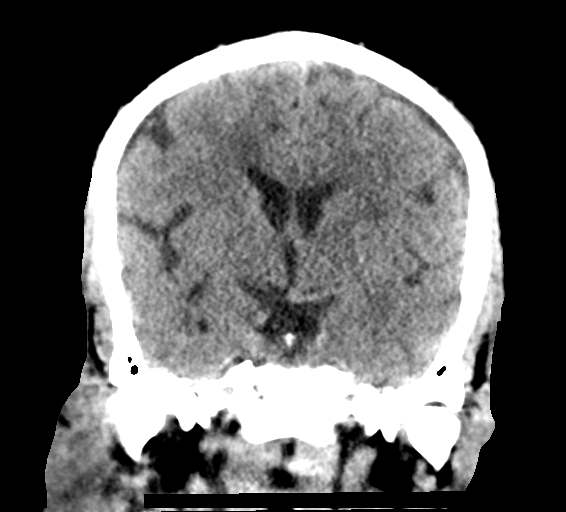

[Series 6: head without sag · sagittal · non-contrast · 0.35mm/px · 3 of 61 slices shown]
[im 21/61  brain]
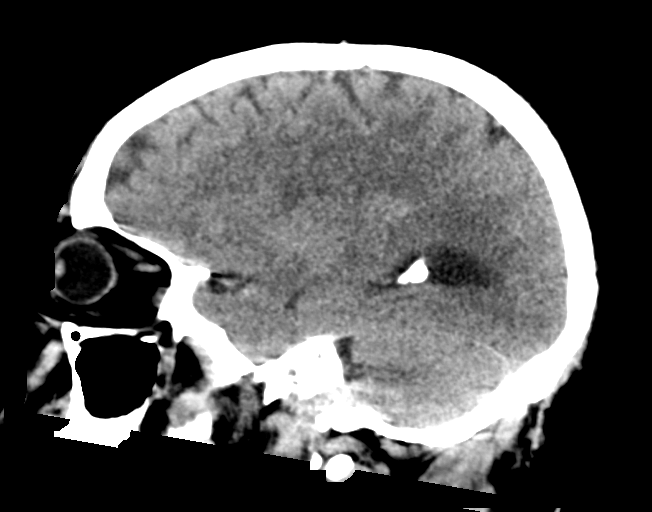
[im 31/61  brain]
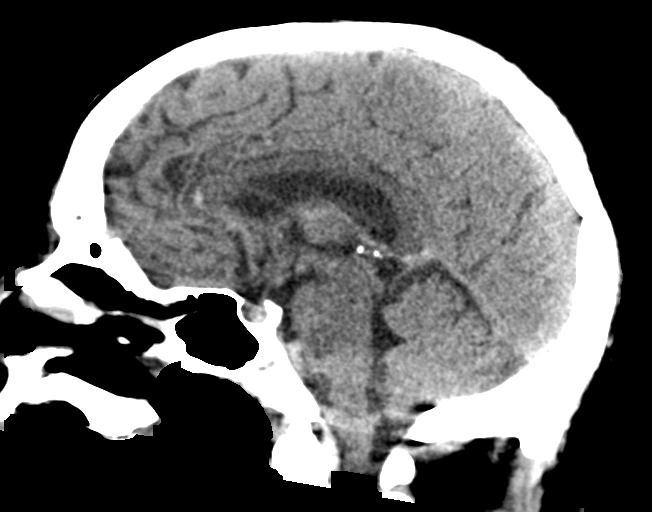
[im 41/61  brain]
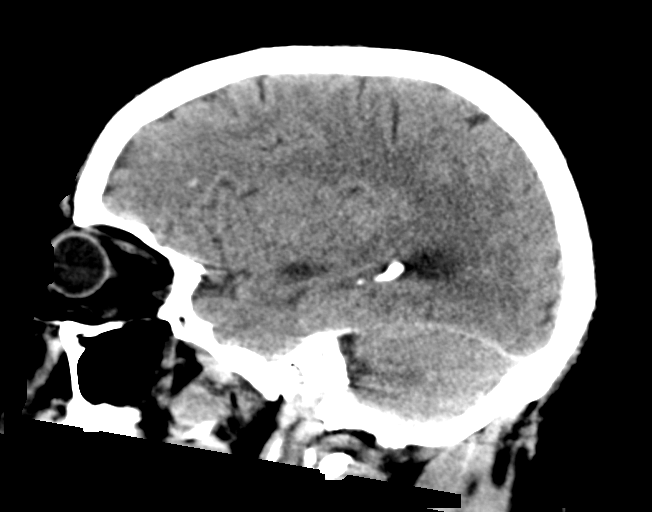

[16 of 47 positions shown; findings below may reference images not displayed]

FINDINGS: Brain: Multiple scattered shear-type subcortical and anterior corpus
callosum hemorrhages show no significant change in size. The largest
is anterior to the frontal horn of the left lateral ventricle at 19
mm. Trace subdural hematoma along the right tentorium is stable.
Mild adjacent right occipital subarachnoid hemorrhage is also
stable-see coronal reformats. Small vessel ischemic type change in
the deep cerebral white matter and deep gray nuclei. No acute
infarct. No hydrocephalus

Vascular: Negative

Skull: Negative

Sinuses/Orbits: Negative
IMPRESSION: Intracranial hemorrhage, primarily shear-type parenchymal hematomas,
shows no progression from 2 days ago.

## 2022-05-13 ENCOUNTER — Ambulatory Visit (INDEPENDENT_AMBULATORY_CARE_PROVIDER_SITE_OTHER): Payer: No Typology Code available for payment source | Admitting: Pulmonary Disease

## 2022-05-13 ENCOUNTER — Encounter: Payer: Self-pay | Admitting: Pulmonary Disease

## 2022-05-13 VITALS — BP 138/84 | HR 78 | Temp 98.2°F | Ht 67.5 in | Wt 193.4 lb

## 2022-05-13 DIAGNOSIS — G4733 Obstructive sleep apnea (adult) (pediatric): Secondary | ICD-10-CM

## 2022-05-13 DIAGNOSIS — R0602 Shortness of breath: Secondary | ICD-10-CM

## 2022-05-13 MED ORDER — HYDROCHLOROTHIAZIDE 25 MG PO TABS: 12.5000 mg | ORAL_TABLET | Freq: Every day | ORAL | 3 refills | Status: AC | PRN

## 2022-05-13 MED ORDER — ESCITALOPRAM OXALATE 10 MG PO TABS: 10.0000 mg | ORAL_TABLET | Freq: Every day | ORAL | 3 refills | Status: AC

## 2022-05-13 MED ORDER — FUROSEMIDE 20 MG PO TABS: 20.0000 mg | ORAL_TABLET | Freq: Every day | ORAL | 3 refills | Status: AC

## 2022-05-13 NOTE — Progress Notes (Signed)
Bradley Silva    [id number]    06-09-1946  Primary [facility] Physician:[facility]  Referring Physician: Charlyne Petrin, MD [name] ,  [name] [id number]  Chief complaint:   Patient is being followed for shortness of breath  HPI:  History of shortness of breath, emphysema, COPD -On Stiolto and albuterol  History of heart failure, coronary artery disease,- -Most recent ejection fraction of 40% -S/p stenting in the past  History of obstructive sleep apnea Use CPAP for a while has not been using it because he felt he was getting pneumonias from it Has not used CPAP for about a year  Exercise tolerance is decreased, unable to walk 100 yards  He does get anxiety and this impacts his breathing makes it more short of breath  He is supposed to be on water pills on a regular basis but has not taken them in a few days he does have leg swelling, ran out of the medications  Reformed smoker, quit smoking about 2 years ago was smoking about a pack and a half of cigarettes  Outpatient Encounter Medications as of 05/13/2022  Medication Sig   acetaminophen (TYLENOL) 325 MG tablet Take 2 tablets (650 mg total) by mouth every 6 (six) hours as needed.   albuterol (VENTOLIN HFA) 108 (90 Base) MCG/ACT inhaler INHALE 2 PUFFS BY MOUTH FOUR TIMES A DAY   albuterol (VENTOLIN HFA) 108 (90 Base) MCG/ACT inhaler 2 puffs as needed Inhalation every 6 hrs   aspirin EC 81 MG tablet Take 81 mg by mouth daily.   atorvastatin (LIPITOR) 20 MG tablet TAKE ONE-HALF TABLET BY MOUTH AT BEDTIME FOR CHOLESTEROL   Cholecalciferol (VITAMIN D3) 100000 UNIT/GM POWD 1 capsule Orally Once a day   furosemide (LASIX) 20 MG tablet 1 tablet Orally Once a day   hydrochlorothiazide (HYDRODIURIL) 25 MG tablet Take 12.5 mg by mouth daily as needed (for elevated blood pressure).   lisinopril (ZESTRIL) 40 MG tablet 1 tablet Orally Once a day   losartan (COZAAR) 100 MG tablet Take 1 tablet by mouth  daily.   pantoprazole (PROTONIX) 40 MG tablet TAKE ONE TABLET BY MOUTH TWICE A DAY BEFORE MEALS (TAKE ON AN EMPTY STOMACH 30 MINUTES PRIOR TO A MEAL)   Tiotropium Bromide-Olodaterol 2.5-2.5 MCG/ACT AERS INHALE 2 PUFFS BY MOUTH DAILY   [DISCONTINUED] oxyCODONE (ROXICODONE) 5 MG immediate release tablet Take 1 tablet (5 mg total) by mouth every 4 (four) hours as needed. (Patient not taking: Reported on 05/13/2022)   No facility-administered encounter medications on file as of 05/13/2022.    Allergies as of 05/13/2022 - Review Complete 05/13/2022  Allergen Reaction Noted   Ivp dye [iodinated contrast media] Anaphylaxis 07/25/2017   Shellfish allergy Rash 07/25/2017    Past Medical History:  Diagnosis Date   Asthma    Cancer ([place])    Hypertension     No past surgical history on file.  No family history on file.  Social History   Socioeconomic History   Marital status: Married    Spouse name: Not on file   Number of children: Not on file   Years of education: Not on file   Highest education level: Not on file  Occupational History   Not on file  Tobacco Use   Smoking status: Former    Packs/day: 2.00    Types: Cigarettes    Quit date: 04/2020    Years since quitting: 2.0    Passive  exposure: Past   Smokeless tobacco: Never  Substance and Sexual Activity   Alcohol use: No   Drug use: No   Sexual activity: Not on file  Other Topics Concern   Not on file  Social History Narrative   ** Merged History Encounter **       Social Determinants of Health   Financial Resource Strain: Not on file  Food Insecurity: Not on file  Transportation Needs: Not on file  Physical Activity: Not on file  Stress: Not on file  Social Connections: Not on file  Intimate Partner Violence: Not on file    Review of Systems  Constitutional:  Positive for fatigue.  Respiratory:  Positive for shortness of breath.   Psychiatric/Behavioral:  Positive for sleep disturbance.     Vitals:    05/13/22 1414  BP: 138/84  Pulse: 78  Temp: 98.2 F (36.8 C)  SpO2: 97%     Physical Exam Constitutional:      Appearance: He is obese.  HENT:     Head: Normocephalic.     Mouth/Throat:     Mouth: Mucous membranes are moist.  Eyes:     Pupils: Pupils are equal, round, and reactive to light.  Cardiovascular:     Rate and Rhythm: Normal rate and regular rhythm.     Heart sounds: No murmur heard.    No friction rub.  Pulmonary:     Effort: No respiratory distress.     Breath sounds: No stridor. No wheezing or rhonchi.     Comments: Decreased air movement bilaterally Musculoskeletal:     Cervical back: No rigidity or tenderness.  Neurological:     Mental Status: He is alert.      Data Reviewed: Records from care everywhere reviewed showing recent cardiac cath showing ejection fraction of 40%  Sleep study not available  He is supposed to be on diuretics which he has not been taking  Assessment:  Shortness of breath  Chronic obstructive pulmonary disease/emphysema  Coronary artery disease/heart failure  Shortness of breath with activity  Obstructive sleep apnea -Untreated at present  History of anxiety with associated shortness of breath  Inhaler technique was reviewed and taught today  Plan/Recommendations: Continue Stiolto and albuterol as needed  Prescription for use diuretics sent into pharmacy for him  Importance of regular exercises was discussed with the patient  Encouraged to start walking on a regular basis and maintain a step count  Encouraged to follow-up with the [place] regarding his sleep apnea  I will see him back in about 3 months  Encouraged to call with any significant concerns   Started him on a course of Lexapro for his anxiety-10 mg  Sherrilyn Rist MD [facility] 05/13/2022, 2:16 PM  CC: Bradley Petrin, MD

## 2022-05-13 NOTE — Patient Instructions (Signed)
I will see you back in 3 months  Regular exercises as tolerated  Make sure you are getting many steps in every day  Prescription for your water pills-sent to pharmacy -Make sure you are taking them regularly  Prescription for something for anxiety-Lexapro sent to pharmacy If you are not tolerating this well, just wean yourself off of it  Follow-up with the [facility] regarding your sleep apnea  Ensure you are using the medications correctly  Continue with Stiolto and albuterol as needed  Call with significant concerns

## 2022-08-16 ENCOUNTER — Ambulatory Visit: Payer: No Typology Code available for payment source | Admitting: Pulmonary Disease

## 2022-10-13 ENCOUNTER — Ambulatory Visit (INDEPENDENT_AMBULATORY_CARE_PROVIDER_SITE_OTHER): Payer: No Typology Code available for payment source | Admitting: Pulmonary Disease

## 2022-10-13 ENCOUNTER — Encounter: Payer: Self-pay | Admitting: Pulmonary Disease

## 2022-10-13 VITALS — BP 132/86 | HR 88 | Ht 67.5 in | Wt 198.6 lb

## 2022-10-13 DIAGNOSIS — J431 Panlobular emphysema: Secondary | ICD-10-CM

## 2022-10-13 DIAGNOSIS — R0602 Shortness of breath: Secondary | ICD-10-CM | POA: Diagnosis not present

## 2022-10-13 DIAGNOSIS — G4733 Obstructive sleep apnea (adult) (pediatric): Secondary | ICD-10-CM

## 2022-10-13 NOTE — Patient Instructions (Signed)
Schedule for pulmonary function test  Graded activities as tolerated  Continue using Stiolto and albuterol  Prescription for Lexapro was provided during the last visit, make sure you are using it   Try and pace yourself with activities  Call with significant concerns

## 2022-10-13 NOTE — Addendum Note (Signed)
Addended by: Alvin Critchley on: 10/13/2022 04:58 PM   Modules accepted: Orders

## 2022-10-13 NOTE — Progress Notes (Signed)
Bradley Silva    [id number]    08/21/1946  Primary Care Physician:[facility]  Referring Physician: [facility] [place] [address] [name],  [place] [address]  Chief complaint:   Patient is being followed for shortness of breath  HPI:  Patient with history of COPD, emphysema -On Stiolto and albuterol  History of heart failure with coronary artery disease, ejection fraction 40%  History of obstructive sleep apnea Has not been using CPAP recently  Exercise tolerance is poor, less than 50 steps  He did describe having anxiety/panic attacks for which he was prescribed Lexapro during the last visit but does not recollect that he has started using it  Reformed smoker, quit smoking about 2 years ago was smoking about a pack and a half of cigarettes  Outpatient Encounter Medications as of 10/13/2022  Medication Sig   albuterol (VENTOLIN HFA) 108 (90 Base) MCG/ACT inhaler INHALE 2 PUFFS BY MOUTH FOUR TIMES A DAY   Tiotropium Bromide-Olodaterol 2.5-2.5 MCG/ACT AERS INHALE 2 PUFFS BY MOUTH DAILY   acetaminophen (TYLENOL) 325 MG tablet Take 2 tablets (650 mg total) by mouth every 6 (six) hours as needed.   aspirin EC 81 MG tablet Take 81 mg by mouth daily.   atorvastatin (LIPITOR) 20 MG tablet TAKE ONE-HALF TABLET BY MOUTH AT BEDTIME FOR CHOLESTEROL   Cholecalciferol (VITAMIN D3) 100000 UNIT/GM POWD 1 capsule Orally Once a day   escitalopram (LEXAPRO) 10 MG tablet Take 1 tablet (10 mg total) by mouth daily.   furosemide (LASIX) 20 MG tablet Take 1 tablet (20 mg total) by mouth daily.   hydrochlorothiazide (HYDRODIURIL) 25 MG tablet Take 0.5 tablets (12.5 mg total) by mouth daily as needed (for elevated blood pressure).   lisinopril (ZESTRIL) 40 MG tablet 1 tablet Orally Once a day   losartan (COZAAR) 100 MG tablet Take 1 tablet by mouth daily.   pantoprazole (PROTONIX) 40 MG tablet TAKE ONE TABLET BY MOUTH TWICE A DAY BEFORE MEALS (TAKE ON AN EMPTY STOMACH 30  MINUTES PRIOR TO A MEAL)   No facility-administered encounter medications on file as of 10/13/2022.    Allergies as of 10/13/2022 - Review Complete 10/13/2022  Allergen Reaction Noted   Ivp dye [iodinated contrast media] Anaphylaxis 07/25/2017   Shellfish allergy Rash 07/25/2017    Past Medical History:  Diagnosis Date   Asthma    Cancer ([place])    Hypertension     No past surgical history on file.  No family history on file.  Social History   Socioeconomic History   Marital status: Married    Spouse name: Not on file   Number of children: Not on file   Years of education: Not on file   Highest education level: Not on file  Occupational History   Not on file  Tobacco Use   Smoking status: Former    Packs/day: 2.00    Types: Cigarettes    Quit date: 04/2020    Years since quitting: 2.4    Passive exposure: Past   Smokeless tobacco: Never  Substance and Sexual Activity   Alcohol use: No   Drug use: No   Sexual activity: Not on file  Other Topics Concern   Not on file  Social History Narrative   ** Merged History Encounter **       Social Determinants of Health   Financial Resource Strain: Not on file  Food Insecurity: Not on file  Transportation Needs: Not on  file  Physical Activity: Not on file  Stress: Not on file  Social Connections: Not on file  Intimate Partner Violence: Not on file    Review of Systems  Constitutional:  Positive for fatigue.  Respiratory:  Positive for shortness of breath.   Psychiatric/Behavioral:  Positive for sleep disturbance.     Vitals:   10/13/22 1523  BP: 132/86  Pulse: 88  SpO2: 93%     Physical Exam Constitutional:      Appearance: He is obese.  HENT:     Head: Normocephalic.     Mouth/Throat:     Mouth: Mucous membranes are moist.  Eyes:     Pupils: Pupils are equal, round, and reactive to light.  Cardiovascular:     Rate and Rhythm: Normal rate and regular rhythm.     Heart sounds: No murmur heard.     No friction rub.  Pulmonary:     Effort: No respiratory distress.     Breath sounds: No stridor. No wheezing or rhonchi.     Comments: Decreased air movement bilaterally Musculoskeletal:     Cervical back: No rigidity or tenderness.  Neurological:     Mental Status: He is alert.     Data Reviewed: Records from care everywhere reviewed showing recent cardiac cath showing ejection fraction of 40%  Sleep study not available  No PFT on record  Assessment:  Shortness of breath -Multifactorial -Likely related to deconditioning and obstructive lung disease  Chronic obstructive pulmonary disease/emphysema -Has significant emphysema on past CT scan of the chest  Coronary artery disease/heart failure  Obstructive sleep apnea -Untreated at present  History of anxiety with associated shortness of breath -Prescribed Lexapro during last visit -Has not been using it  Inhaler technique was reviewed and taught today  Plan/Recommendations: Encouraged to continue using inhalers  Graded activities as tolerated  Encouraged to start walking on a regular basis  Will order a pulmonary function test  I will see him back in about 3 months  Encouraged to call with significant concerns  Sherrilyn Rist MD [facility] 10/13/2022, 3:33 PM  CC: Bradley Silva

## 2022-10-25 ENCOUNTER — Ambulatory Visit: Payer: No Typology Code available for payment source | Admitting: Pulmonary Disease

## 2022-10-25 ENCOUNTER — Encounter: Payer: Self-pay | Admitting: Pulmonary Disease

## 2022-10-25 ENCOUNTER — Ambulatory Visit (INDEPENDENT_AMBULATORY_CARE_PROVIDER_SITE_OTHER): Payer: No Typology Code available for payment source | Admitting: Pulmonary Disease

## 2022-10-25 VITALS — BP 132/78 | HR 68 | Temp 98.6°F | Ht 67.0 in | Wt 190.0 lb

## 2022-10-25 DIAGNOSIS — G4733 Obstructive sleep apnea (adult) (pediatric): Secondary | ICD-10-CM | POA: Diagnosis not present

## 2022-10-25 DIAGNOSIS — J431 Panlobular emphysema: Secondary | ICD-10-CM

## 2022-10-25 DIAGNOSIS — R0602 Shortness of breath: Secondary | ICD-10-CM

## 2022-10-25 LAB — PULMONARY FUNCTION TEST
DL/VA % pred: 59 %
DL/VA: 2.41 ml/min/mmHg/L
DLCO cor % pred: 37 %
DLCO cor: 8.54 ml/min/mmHg
DLCO unc % pred: 37 %
DLCO unc: 8.54 ml/min/mmHg
FEF 25-75 Post: 0.44 L/sec
FEF 25-75 Pre: 0.19 L/sec
FEF2575-%Change-Post: 127 %
FEF2575-%Pred-Post: 22 %
FEF2575-%Pred-Pre: 9 %
FEV1-%Change-Post: 51 %
FEV1-%Pred-Post: 26 %
FEV1-%Pred-Pre: 17 %
FEV1-Post: 0.73 L
FEV1-Pre: 0.48 L
FEV1FVC-%Change-Post: 0 %
FEV1FVC-%Pred-Pre: 45 %
FEV6-%Change-Post: 51 %
FEV6-%Pred-Post: 55 %
FEV6-%Pred-Pre: 36 %
FEV6-Post: 1.96 L
FEV6-Pre: 1.29 L
FEV6FVC-%Change-Post: 0 %
FEV6FVC-%Pred-Post: 95 %
FEV6FVC-%Pred-Pre: 95 %
FVC-%Change-Post: 50 %
FVC-%Pred-Post: 57 %
FVC-%Pred-Pre: 38 %
FVC-Post: 2.19 L
FVC-Pre: 1.45 L
Post FEV1/FVC ratio: 33 %
Post FEV6/FVC ratio: 90 %
Pre FEV1/FVC ratio: 33 %
Pre FEV6/FVC Ratio: 89 %

## 2022-10-25 MED ORDER — ALBUTEROL SULFATE HFA 108 (90 BASE) MCG/ACT IN AERS: 2.0000 | INHALATION_SPRAY | Freq: Four times a day (QID) | RESPIRATORY_TRACT | 6 refills | Status: AC | PRN

## 2022-10-25 NOTE — Progress Notes (Signed)
Bradley Silva    [id number]    1945/11/27  [facility] Phys[facility]  Referring Physician: [facility] [place] [address] [place],  [place] [address]  Chief complaint:   Patient is being followed for shortness of breath  HPI:  Patient with a history of COPD, emphysema -On Stiolto, albuterol as needed, has not been using albuterol -Was recently started on Asmanex  History of heart failure with coronary artery disease, ejection fraction 40%  History of obstructive sleep apnea Has not been using CPAP recently  Exercise tolerance is poor, less than 50 steps or less sometimes  Denies any chest pains or chest discomfort  Has not been using his CPAP  He did describe having anxiety/panic attacks for which he was prescribed Lexapro during the last visit but does not recollect that he has started using it  Reformed smoker, quit smoking about 2 years ago was smoking about a pack and a half of cigarettes  Outpatient Encounter Medications as of 10/25/2022  Medication Sig   acetaminophen (TYLENOL) 325 MG tablet Take 2 tablets (650 mg total) by mouth every 6 (six) hours as needed.   albuterol (VENTOLIN HFA) 108 (90 Base) MCG/ACT inhaler INHALE 2 PUFFS BY MOUTH FOUR TIMES A DAY   aspirin EC 81 MG tablet Take 81 mg by mouth daily.   atorvastatin (LIPITOR) 20 MG tablet TAKE ONE-HALF TABLET BY MOUTH AT BEDTIME FOR CHOLESTEROL   Cholecalciferol (VITAMIN D3) 100000 UNIT/GM POWD 1 capsule Orally Once a day   escitalopram (LEXAPRO) 10 MG tablet Take 1 tablet (10 mg total) by mouth daily.   furosemide (LASIX) 20 MG tablet Take 1 tablet (20 mg total) by mouth daily.   hydrochlorothiazide (HYDRODIURIL) 25 MG tablet Take 0.5 tablets (12.5 mg total) by mouth daily as needed (for elevated blood pressure).   lisinopril (ZESTRIL) 40 MG tablet 1 tablet Orally Once a day   losartan (COZAAR) 100 MG tablet Take 1 tablet by mouth daily.   pantoprazole (PROTONIX) 40 MG  tablet TAKE ONE TABLET BY MOUTH TWICE A DAY BEFORE MEALS (TAKE ON AN EMPTY STOMACH 30 MINUTES PRIOR TO A MEAL)   Tiotropium Bromide-Olodaterol 2.5-2.5 MCG/ACT AERS INHALE 2 PUFFS BY MOUTH DAILY   No facility-administered encounter medications on file as of 10/25/2022.    Allergies as of 10/25/2022 - Review Complete 10/25/2022  Allergen Reaction Noted   Ivp dye [iodinated contrast media] Anaphylaxis 07/25/2017   Shellfish allergy Rash 07/25/2017    Past Medical History:  Diagnosis Date   Asthma    Cancer ([place])    Hypertension     No past surgical history on file.  No family history on file.  Social History   Socioeconomic History   Marital status: Married    Spouse name: Not on file   Number of children: Not on file   Years of education: Not on file   Highest education level: Not on file  Occupational History   Not on file  Tobacco Use   Smoking status: Former    Packs/day: 2.00    Types: Cigarettes    Quit date: 04/2020    Years since quitting: 2.5    Passive exposure: Past   Smokeless tobacco: Never  Substance and Sexual Activity   Alcohol use: No   Drug use: No   Sexual activity: Not on file  Other Topics Concern   Not on file  Social History Narrative   ** Merged History Encounter **  Social Determinants of Health   Financial Resource Strain: Not on file  Food Insecurity: Not on file  Transportation Needs: Not on file  Physical Activity: Not on file  Stress: Not on file  Social Connections: Not on file  Intimate Partner Violence: Not on file    Review of Systems  Constitutional:  Positive for fatigue.  Respiratory:  Positive for shortness of breath.   Psychiatric/Behavioral:  Positive for sleep disturbance.     Vitals:   10/25/22 1449  BP: 132/78  Pulse: 68  Temp: 98.6 F (37 C)  SpO2: 95%     Physical Exam Constitutional:      Appearance: He is obese.  HENT:     Head: Normocephalic.     Mouth/Throat:     Mouth: Mucous  membranes are moist.  Eyes:     Pupils: Pupils are equal, round, and reactive to light.  Cardiovascular:     Rate and Rhythm: Normal rate and regular rhythm.     Heart sounds: No murmur heard.    No friction rub.  Pulmonary:     Effort: No respiratory distress.     Breath sounds: No stridor. No wheezing or rhonchi.     Comments: Decreased air movement bilaterally Musculoskeletal:     Cervical back: No rigidity or tenderness.  Neurological:     Mental Status: He is alert.  Psychiatric:        Mood and Affect: Mood normal.    Data Reviewed: Records from care everywhere reviewed showing recent cardiac cath showing ejection fraction of 40%  Sleep study not available  PFT with very severe obstructive airway disease with significant bronchodilator response-this was reviewed with the patient during the visit today  Inhaler technique was reviewed today   Assessment:  Shortness of breath -Multifactorial -Likely related to deconditioning and obstructive lung disease -Does have severe obstructive lung disease on his PFT  Chronic obstructive pulmonary disease/emphysema -Has significant emphysema on past CT scan of the chest -PFT shows severe obstructive disease with severe reduction in diffusing capacity  Coronary artery disease/heart failure  Obstructive sleep apnea -Untreated at present -Not using CPAP  History of anxiety with associated shortness of breath -Prescribed Lexapro during last visit -Has not been using it  Inhaler technique was reviewed and taught today-improved  Plan/Recommendations: Use Stiolto on a daily basis  Use Asmanex twice a day  Prescription for albuterol sent to pharmacy  Graded activities as tolerated  I will see you back in about 3 months  Call with significant concerns  Sherrilyn Rist MD [facility] 10/25/2022, 3:01 PM  CC: [place]

## 2022-10-25 NOTE — Progress Notes (Signed)
PFT done today. 

## 2022-10-25 NOTE — Patient Instructions (Addendum)
Will see you in about 3 months  Your Stiolto should be used 2 puffs once a day on a regular basis  Asmanex to be used 2 puffs twice a day  Albuterol to be used 2 puffs up to 4 times a day as needed   Try and use your CPAP nightly

## 2022-11-23 ENCOUNTER — Telehealth: Payer: Self-pay | Admitting: Pulmonary Disease

## 2022-11-23 NOTE — Telephone Encounter (Signed)
Printed off office notes from office visit 10/25/2022 with Dr  Jenetta Downer. Faxed to [place] [facility] with attn to [place] as requested. Nothing further needed

## 2022-12-23 ENCOUNTER — Encounter (HOSPITAL_BASED_OUTPATIENT_CLINIC_OR_DEPARTMENT_OTHER): Payer: Self-pay

## 2022-12-23 ENCOUNTER — Emergency Department (HOSPITAL_BASED_OUTPATIENT_CLINIC_OR_DEPARTMENT_OTHER): Payer: No Typology Code available for payment source

## 2022-12-23 ENCOUNTER — Emergency Department (HOSPITAL_BASED_OUTPATIENT_CLINIC_OR_DEPARTMENT_OTHER)
Admission: EM | Admit: 2022-12-23 | Discharge: 2022-12-23 | Disposition: A | Discharge status: Home / Self Care | Payer: No Typology Code available for payment source | Attending: Emergency Medicine | Admitting: Emergency Medicine

## 2022-12-23 DIAGNOSIS — Z7951 Long term (current) use of inhaled steroids: Secondary | ICD-10-CM | POA: Diagnosis not present

## 2022-12-23 DIAGNOSIS — J45909 Unspecified asthma, uncomplicated: Secondary | ICD-10-CM | POA: Diagnosis not present

## 2022-12-23 DIAGNOSIS — Z87891 Personal history of nicotine dependence: Secondary | ICD-10-CM | POA: Insufficient documentation

## 2022-12-23 DIAGNOSIS — M25552 Pain in left hip: Secondary | ICD-10-CM | POA: Diagnosis present

## 2022-12-23 DIAGNOSIS — Z7982 Long term (current) use of aspirin: Secondary | ICD-10-CM | POA: Diagnosis not present

## 2022-12-23 DIAGNOSIS — I10 Essential (primary) hypertension: Secondary | ICD-10-CM | POA: Insufficient documentation

## 2022-12-23 DIAGNOSIS — M160 Bilateral primary osteoarthritis of hip: Secondary | ICD-10-CM | POA: Insufficient documentation

## 2022-12-23 DIAGNOSIS — Z79899 Other long term (current) drug therapy: Secondary | ICD-10-CM | POA: Insufficient documentation

## 2022-12-23 MED ORDER — HYDROCODONE-ACETAMINOPHEN 5-325 MG PO TABS: 1.0000 | ORAL_TABLET | Freq: Four times a day (QID) | ORAL | 0 refills | Status: AC | PRN
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
## 2022-12-23 NOTE — ED Provider Notes (Signed)
[facility] Provider Note   CSN: [id number] Arrival date & time: 12/23/22  1407     History  Chief Complaint  Patient presents with   Leg Pain    Bradley Silva is a 77 y.o. male.  Patient with a known history of osteoarthritis to both hips.  Followed at the [facility]  Patient with increased pain predominately to the left hip starting about 2 to 3 days ago.  Been taking Motrin for that.  No fall or injury.  With patient's concern that something different has occurred.  The left hip pain also radiates down to the knee but it is not posterior.  Definitely hurts to move at the left hip area.  Past medical history sniffing for asthma hypertension former smoker quit in 2021.       Home Medications Prior to Admission medications   Medication Sig Start Date End Date Taking? Authorizing Provider  HYDROcodone-acetaminophen (NORCO/VICODIN) 5-325 MG tablet Take 1 tablet by mouth every 6 (six) hours as needed for moderate pain. 12/23/22  Yes Fredia Sorrow, MD  acetaminophen (TYLENOL) 325 MG tablet Take 2 tablets (650 mg total) by mouth every 6 (six) hours as needed. 06/19/19   Saverio Danker, PA-C  albuterol (VENTOLIN HFA) 108 (90 Base) MCG/ACT inhaler INHALE 2 PUFFS BY MOUTH FOUR TIMES A DAY 09/25/21   [provider]  albuterol (VENTOLIN HFA) 108 (90 Base) MCG/ACT inhaler Inhale 2 puffs into the lungs every 6 (six) hours as needed for wheezing or shortness of breath. 10/25/22   Sherrilyn Rist A, MD  aspirin EC 81 MG tablet Take 81 mg by mouth daily.    [provider]  atorvastatin (LIPITOR) 20 MG tablet TAKE ONE-HALF TABLET BY MOUTH AT BEDTIME FOR CHOLESTEROL 04/08/22   [provider]  Cholecalciferol (VITAMIN D3) 100000 UNIT/GM POWD 1 capsule Orally Once a day    [provider]  escitalopram (LEXAPRO) 10 MG tablet Take 1 tablet (10 mg total) by mouth daily. 05/13/22   Sherrilyn Rist A, MD   furosemide (LASIX) 20 MG tablet Take 1 tablet (20 mg total) by mouth daily. 05/13/22   Laurin Coder, MD  hydrochlorothiazide (HYDRODIURIL) 25 MG tablet Take 0.5 tablets (12.5 mg total) by mouth daily as needed (for elevated blood pressure). 05/13/22   Olalere, Cicero Duck A, MD  lisinopril (ZESTRIL) 40 MG tablet 1 tablet Orally Once a day    [provider]  losartan (COZAAR) 100 MG tablet Take 1 tablet by mouth daily. 04/08/22   [provider]  pantoprazole (PROTONIX) 40 MG tablet TAKE ONE TABLET BY MOUTH TWICE A DAY BEFORE MEALS (TAKE ON AN EMPTY STOMACH 30 MINUTES PRIOR TO A MEAL) 04/08/22   [provider]  Tiotropium Bromide-Olodaterol 2.5-2.5 MCG/ACT AERS INHALE 2 PUFFS BY MOUTH DAILY 09/25/21   [provider]      Allergies    Ivp dye [iodinated contrast media] and Shellfish allergy    Review of Systems   Review of Systems  Constitutional:  Negative for chills and fever.  HENT:  Negative for ear pain and sore throat.   Eyes:  Negative for pain and visual disturbance.  Respiratory:  Negative for cough and shortness of breath.   Cardiovascular:  Negative for chest pain and palpitations.  Gastrointestinal:  Negative for abdominal pain and vomiting.  Genitourinary:  Negative for dysuria and hematuria.  Musculoskeletal:  Negative for arthralgias and back pain.  Skin:  Negative for color  change and rash.  Neurological:  Negative for seizures and syncope.  All other systems reviewed and are negative.   Physical Exam Updated Vital Signs BP (!) 207/108   Pulse 95   Temp 98.3 F (36.8 C) (Oral)   Resp 20   Ht 1.715 m (5' 7.5")   Wt 85.3 kg   SpO2 95%   BMI 29.01 kg/m  Physical Exam Vitals and nursing note reviewed.  Constitutional:      General: He is not in acute distress.    Appearance: Normal appearance. He is well-developed.  HENT:     Head: Normocephalic and atraumatic.  Eyes:     Extraocular Movements: Extraocular movements intact.      Conjunctiva/sclera: Conjunctivae normal.     Pupils: Pupils are equal, round, and reactive to light.  Cardiovascular:     Rate and Rhythm: Normal rate and regular rhythm.     Heart sounds: No murmur heard. Pulmonary:     Effort: Pulmonary effort is normal. No respiratory distress.     Breath sounds: Normal breath sounds.  Abdominal:     Palpations: Abdomen is soft.     Tenderness: There is no abdominal tenderness.  Musculoskeletal:        General: No swelling, tenderness or deformity.     Cervical back: Normal range of motion and neck supple.  Skin:    General: Skin is warm and dry.     Capillary Refill: Capillary refill takes less than 2 seconds.  Neurological:     General: No focal deficit present.     Mental Status: He is alert and oriented to person, place, and time.  Psychiatric:        Mood and Affect: Mood normal.     ED Results / Procedures / Treatments   Labs (all labs ordered are listed, but only abnormal results are displayed) Labs Reviewed - No data to display  EKG None  Radiology DG Hip Unilat With Pelvis 2-3 Views Left  Result Date: 12/23/2022 CLINICAL DATA:  Left hip pain EXAM: DG HIP (WITH OR WITHOUT PELVIS) 2-3V LEFT COMPARISON:  None Available. FINDINGS: Frontal view of the pelvis as well as frontal and frogleg lateral views of the left hip are obtained. No acute fracture, subluxation, or dislocation. There is symmetrical bilateral hip osteoarthritis. Prominent lower lumbar spondylosis. Sacroiliac joints are unremarkable. IMPRESSION: 1. Symmetrical bilateral hip osteoarthritis. 2. No acute displaced fracture. Electronically Signed   By: Randa Ngo M.D.   On: 12/23/2022 17:43    Procedures Procedures    Medications Ordered in ED Medications - No data to display  ED Course/ Medical Decision Making/ A&P                             Medical Decision Making Risk Prescription drug management.   Patient nontoxic no acute distress.  X-rays show  symmetrical bilateral hip osteoarthritis no acute fracture or displaced meant.  I will treat with a course of hydrocodone have him follow-up with sports medicine as well as the [place] clinic.   Final Clinical Impression(s) / ED Diagnoses Final diagnoses:  Left hip pain  Osteoarthritis of both hips, unspecified osteoarthritis type    Rx / DC Orders ED Discharge Orders          Ordered    HYDROcodone-acetaminophen (NORCO/VICODIN) 5-325 MG tablet  Every 6 hours PRN        12/23/22 1944  Fredia Sorrow, MD 12/23/22 1946

## 2022-12-23 NOTE — ED Notes (Signed)
Discharge paperwork reviewed entirely with patient, including Rx's and follow up care. Pain was under control. Pt verbalized understanding as well as all parties involved. No questions or concerns voiced at the time of discharge. No acute distress noted.   Pt ambulated out to PVA without incident or assistance.

## 2022-12-23 NOTE — Discharge Instructions (Signed)
Take the pain medication as directed.  X-rays of both hips just show osteoarthritis which can be painful.  No evidence of fracture or dislocation.  Make an appointment to follow back up with the [facility] also can make an appointment follow-up with sports medicine upstairs.

## 2023-02-03 ENCOUNTER — Other Ambulatory Visit: Payer: Self-pay | Admitting: Pulmonary Disease

## 2023-02-07 ENCOUNTER — Other Ambulatory Visit: Payer: Self-pay | Admitting: Pulmonary Disease

## 2023-03-16 ENCOUNTER — Ambulatory Visit (INDEPENDENT_AMBULATORY_CARE_PROVIDER_SITE_OTHER): Payer: No Typology Code available for payment source | Admitting: Pulmonary Disease

## 2023-03-16 ENCOUNTER — Encounter: Payer: Self-pay | Admitting: Pulmonary Disease

## 2023-03-16 VITALS — BP 124/78 | HR 80 | Ht 67.5 in | Wt 190.0 lb

## 2023-03-16 DIAGNOSIS — R0602 Shortness of breath: Secondary | ICD-10-CM | POA: Diagnosis not present

## 2023-03-16 DIAGNOSIS — G4733 Obstructive sleep apnea (adult) (pediatric): Secondary | ICD-10-CM

## 2023-03-16 DIAGNOSIS — J431 Panlobular emphysema: Secondary | ICD-10-CM | POA: Diagnosis not present

## 2023-03-16 NOTE — Patient Instructions (Signed)
Continue using current inhalers including albuterol  Make sure you are using the albuterol correctly  Continue Stiolto  Graded activities, regular activities as tolerated  I will see you back in about 3 months  Call with any significant concerns

## 2023-03-16 NOTE — Progress Notes (Signed)
Bradley Silva    [id number]    September 25, 1946  Primary Care Physician:[facility]  Referring Physician: [facility] [address] [place],  [place] [address]  Chief complaint:   Patient is being followed for shortness of breath In for follow-up today  HPI:  Patient with a history of COPD, emphysema -On Stiolto, albuterol as needed, has not been using albuterol -Was recently started on Asmanex -Breathing has been relatively stable  History of heart failure with coronary artery disease, ejection fraction 40%  History of obstructive sleep apnea Has not been using CPAP recently  Exercise tolerance is poor, less than 50 steps or less sometimes -Feels this is getting better at present -Started trying to get more active  Denies any chest pains or chest discomfort  History of anxiety/panic attacks-prescribed Lexapro  Reformed smoker, quit smoking about 2 years ago was smoking about a pack and a half of cigarettes  Outpatient Encounter Medications as of 03/16/2023  Medication Sig   acetaminophen (TYLENOL) 325 MG tablet Take 2 tablets (650 mg total) by mouth every 6 (six) hours as needed.   albuterol (VENTOLIN HFA) 108 (90 Base) MCG/ACT inhaler INHALE 2 PUFFS BY MOUTH FOUR TIMES A DAY   aspirin EC 81 MG tablet Take 81 mg by mouth daily.   atorvastatin (LIPITOR) 20 MG tablet TAKE ONE-HALF TABLET BY MOUTH AT BEDTIME FOR CHOLESTEROL   Cholecalciferol (VITAMIN D3) 100000 UNIT/GM POWD 1 capsule Orally Once a day   escitalopram (LEXAPRO) 10 MG tablet Take 1 tablet (10 mg total) by mouth daily.   furosemide (LASIX) 20 MG tablet Take 1 tablet (20 mg total) by mouth daily.   hydrochlorothiazide (HYDRODIURIL) 25 MG tablet Take 0.5 tablets (12.5 mg total) by mouth daily as needed (for elevated blood pressure).   HYDROcodone-acetaminophen (NORCO/VICODIN) 5-325 MG tablet Take 1 tablet by mouth every 6 (six) hours as needed for moderate pain.   lisinopril (ZESTRIL) 40  MG tablet 1 tablet Orally Once a day   losartan (COZAAR) 100 MG tablet Take 1 tablet by mouth daily.   Tiotropium Bromide-Olodaterol 2.5-2.5 MCG/ACT AERS INHALE 2 PUFFS BY MOUTH DAILY   albuterol (VENTOLIN HFA) 108 (90 Base) MCG/ACT inhaler Inhale 2 puffs into the lungs every 6 (six) hours as needed for wheezing or shortness of breath. (Patient not taking: Reported on 03/16/2023)   pantoprazole (PROTONIX) 40 MG tablet TAKE ONE TABLET BY MOUTH TWICE A DAY BEFORE MEALS (TAKE ON AN EMPTY STOMACH 30 MINUTES PRIOR TO A MEAL) (Patient not taking: Reported on 03/16/2023)   No facility-administered encounter medications on file as of 03/16/2023.    Allergies as of 03/16/2023 - Review Complete 03/16/2023  Allergen Reaction Noted   Ivp dye [iodinated contrast media] Anaphylaxis 07/25/2017   Enalapril-hydrochlorothiazide Other (See Comments) 05/01/2014   Shellfish allergy Rash 07/25/2017    Past Medical History:  Diagnosis Date   Asthma    Cancer    Hypertension     No past surgical history on file.  No family history on file.  Social History   Socioeconomic History   Marital status: Married    Spouse name: Not on file   Number of children: Not on file   Years of education: Not on file   Highest education level: Not on file  Occupational History   Not on file  Tobacco Use   Smoking status: Former    Packs/day: 2    Types: Cigarettes    Quit  date: 04/2020    Years since quitting: 2.8    Passive exposure: Past   Smokeless tobacco: Never  Vaping Use   Vaping Use: Never used  Substance and Sexual Activity   Alcohol use: No   Drug use: No   Sexual activity: Not on file  Other Topics Concern   Not on file  Social History Narrative   ** Merged History Encounter **       Social Determinants of Health   Financial Resource Strain: Not on file  Food Insecurity: Not on file  Transportation Needs: Not on file  Physical Activity: Not on file  Stress: Not on file  Social  Connections: Not on file  Intimate Partner Violence: Not on file    Review of Systems  Constitutional:  Positive for fatigue.  Respiratory:  Positive for shortness of breath.   Psychiatric/Behavioral:  Positive for sleep disturbance.     Vitals:   03/16/23 1324  BP: 124/78  Pulse: 80  SpO2: 94%     Physical Exam Constitutional:      Appearance: He is obese.  HENT:     Head: Normocephalic.     Mouth/Throat:     Mouth: Mucous membranes are moist.  Eyes:     Pupils: Pupils are equal, round, and reactive to light.  Cardiovascular:     Rate and Rhythm: Normal rate and regular rhythm.     Heart sounds: No murmur heard.    No friction rub.  Pulmonary:     Effort: No respiratory distress.     Breath sounds: No stridor. No wheezing or rhonchi.     Comments: Decreased air movement bilaterally Musculoskeletal:     Cervical back: No rigidity or tenderness.  Neurological:     Mental Status: He is alert.  Psychiatric:        Mood and Affect: Mood normal.    Data Reviewed: Records from care everywhere reviewed showing recent cardiac cath showing ejection fraction of 40%  Sleep study not available  PFT with very severe obstructive airway disease with significant bronchodilator response-this was reviewed with the patient during the visit today  Inhaler technique was reviewed today -Emphasized slow deliberate breaths and making sure he gets gets in a very deep breath   Assessment:  Shortness of breath -Multifactorial -Related to obstructive lung disease and also some degree of deconditioning -Does have severe obstructive disease on his PFT  Chronic obstructive pulmonary disease/emphysema -Significant emphysema on CT Coronary artery disease/heart failure  Obstructive sleep apnea -Untreated at present -Not using CPAP  History of anxiety with associated shortness of breath -On Lexapro  Inhaler technique was reviewed and taught  today-improved  Plan/Recommendations: Continue Stiolto, continue Asmanex  Albuterol as needed  Graded activities as tolerated  Follow-up in 3 months  Encouraged to call with significant concerns  Virl Diamond MD [facility] 03/16/2023, 1:33 PM  CC: [facility]

## 2023-06-06 ENCOUNTER — Telehealth: Payer: Self-pay | Admitting: Pulmonary Disease

## 2023-06-06 NOTE — Telephone Encounter (Signed)
[facility] informed that patients VA authorization for our office was denied and patient will continue pulmonary care at the [facility]. Left voicemail for patient to call back to cancel his appointment on 7/17 or determine if he wants to keep it and file a different insurance.

## 2023-06-08 ENCOUNTER — Ambulatory Visit: Payer: No Typology Code available for payment source | Admitting: Nurse Practitioner

## 2023-08-09 ENCOUNTER — Ambulatory Visit: Payer: No Typology Code available for payment source | Admitting: Pulmonary Disease

## 2023-08-12 ENCOUNTER — Encounter: Payer: Self-pay | Admitting: Pulmonary Disease

## 2023-09-29 ENCOUNTER — Telehealth (HOSPITAL_COMMUNITY): Payer: Self-pay | Admitting: *Deleted

## 2023-09-29 NOTE — Telephone Encounter (Signed)
Pt wife [name] left message on departmental voicemail for Cardiac and Pulmonary Rehab on behalf of her husband Bradley Silva to schedule an appt.  No other information provided.  Called and left message. Alanson Aly, BSN Cardiac and Emergency planning/management officer

## 2023-10-24 ENCOUNTER — Telehealth (HOSPITAL_COMMUNITY): Payer: Self-pay

## 2023-10-24 NOTE — Telephone Encounter (Signed)
Bradley Silva from the [facility] called and is sending over a referral for the pt! Did inform her that the pts wife called and left a messaged and we had returned her call but have not heard back from the pt or pts wife Bradley Silva.

## 2023-10-27 ENCOUNTER — Telehealth (HOSPITAL_COMMUNITY): Payer: Self-pay

## 2023-10-27 ENCOUNTER — Encounter (HOSPITAL_COMMUNITY): Payer: Self-pay

## 2023-10-27 NOTE — Telephone Encounter (Signed)
Attempted to call patient in regards to Pulmonary Rehab - LM on VM Mailed letter 

## 2023-11-22 ENCOUNTER — Telehealth (HOSPITAL_COMMUNITY): Payer: Self-pay

## 2023-11-22 NOTE — Telephone Encounter (Signed)
No response from in regards to PR.   Closed referral
